# Patient Record
Sex: Female | Born: 1980 | Race: White | Hispanic: No | Marital: Married | Smoking: Never smoker
Health system: Southern US, Community
[De-identification: ages and names within clinical notes are randomized; demographics above are authoritative.]

## PROBLEM LIST (undated history)

## (undated) DIAGNOSIS — N200 Calculus of kidney: Secondary | ICD-10-CM

## (undated) DIAGNOSIS — B019 Varicella without complication: Secondary | ICD-10-CM

## (undated) DIAGNOSIS — E785 Hyperlipidemia, unspecified: Secondary | ICD-10-CM

## (undated) DIAGNOSIS — N390 Urinary tract infection, site not specified: Secondary | ICD-10-CM

## (undated) HISTORY — DX: Varicella without complication: B01.9

## (undated) HISTORY — DX: Urinary tract infection, site not specified: N39.0

## (undated) HISTORY — DX: Calculus of kidney: N20.0

## (undated) HISTORY — DX: Hyperlipidemia, unspecified: E78.5

## (undated) HISTORY — PX: FOOT SURGERY: SHX648

---

## 1998-07-12 ENCOUNTER — Emergency Department (HOSPITAL_COMMUNITY): Admission: EM | Admit: 1998-07-12 | Discharge: 1998-07-12 | Payer: Self-pay | Admitting: Emergency Medicine

## 1998-07-12 ENCOUNTER — Encounter: Payer: Self-pay | Admitting: Emergency Medicine

## 1999-02-25 ENCOUNTER — Other Ambulatory Visit: Admission: RE | Admit: 1999-02-25 | Discharge: 1999-02-25 | Payer: Self-pay | Admitting: Obstetrics and Gynecology

## 2001-05-18 ENCOUNTER — Other Ambulatory Visit: Admission: RE | Admit: 2001-05-18 | Discharge: 2001-05-18 | Payer: Self-pay | Admitting: Obstetrics & Gynecology

## 2002-05-21 ENCOUNTER — Other Ambulatory Visit: Admission: RE | Admit: 2002-05-21 | Discharge: 2002-05-21 | Payer: Self-pay | Admitting: *Deleted

## 2003-05-29 ENCOUNTER — Other Ambulatory Visit: Admission: RE | Admit: 2003-05-29 | Discharge: 2003-05-29 | Payer: Self-pay | Admitting: Obstetrics & Gynecology

## 2003-06-27 ENCOUNTER — Emergency Department (HOSPITAL_COMMUNITY): Admission: EM | Admit: 2003-06-27 | Discharge: 2003-06-27 | Payer: Self-pay | Admitting: Family Medicine

## 2005-03-19 ENCOUNTER — Inpatient Hospital Stay (HOSPITAL_COMMUNITY): Admission: AD | Admit: 2005-03-19 | Discharge: 2005-03-20 | Payer: Self-pay | Admitting: Obstetrics & Gynecology

## 2005-04-01 ENCOUNTER — Inpatient Hospital Stay (HOSPITAL_COMMUNITY): Admission: AD | Admit: 2005-04-01 | Discharge: 2005-04-04 | Payer: Self-pay | Admitting: Urology

## 2005-04-02 ENCOUNTER — Encounter (INDEPENDENT_AMBULATORY_CARE_PROVIDER_SITE_OTHER): Payer: Self-pay | Admitting: *Deleted

## 2005-04-05 ENCOUNTER — Encounter: Admission: RE | Admit: 2005-04-05 | Discharge: 2005-05-02 | Payer: Self-pay | Admitting: Obstetrics & Gynecology

## 2005-05-03 ENCOUNTER — Encounter: Admission: RE | Admit: 2005-05-03 | Discharge: 2005-05-11 | Payer: Self-pay | Admitting: Obstetrics & Gynecology

## 2010-02-21 ENCOUNTER — Inpatient Hospital Stay (HOSPITAL_COMMUNITY): Admission: AD | Admit: 2010-02-21 | Payer: Self-pay | Admitting: Obstetrics and Gynecology

## 2010-06-03 ENCOUNTER — Inpatient Hospital Stay (HOSPITAL_COMMUNITY): Admission: RE | Admit: 2010-06-03 | Payer: Self-pay | Source: Ambulatory Visit | Admitting: Obstetrics & Gynecology

## 2010-06-03 ENCOUNTER — Inpatient Hospital Stay (HOSPITAL_COMMUNITY)
Admission: RE | Admit: 2010-06-03 | Discharge: 2010-06-05 | DRG: 373 | Disposition: A | Discharge status: Home / Self Care | Payer: BLUE CROSS/BLUE SHIELD | Source: Ambulatory Visit | Attending: Obstetrics & Gynecology | Admitting: Obstetrics & Gynecology

## 2010-06-03 DIAGNOSIS — O3660X0 Maternal care for excessive fetal growth, unspecified trimester, not applicable or unspecified: Principal | ICD-10-CM | POA: Diagnosis present

## 2010-06-03 DIAGNOSIS — Z22330 Carrier of Group B streptococcus: Secondary | ICD-10-CM

## 2010-06-03 DIAGNOSIS — O99892 Other specified diseases and conditions complicating childbirth: Secondary | ICD-10-CM | POA: Diagnosis present

## 2010-06-03 DIAGNOSIS — O9903 Anemia complicating the puerperium: Secondary | ICD-10-CM | POA: Diagnosis not present

## 2010-06-03 DIAGNOSIS — D62 Acute posthemorrhagic anemia: Secondary | ICD-10-CM | POA: Diagnosis not present

## 2010-06-03 LAB — CBC
HCT: 34.3 % — ABNORMAL LOW (ref 36.0–46.0)
Hemoglobin: 11.8 g/dL — ABNORMAL LOW (ref 12.0–15.0)
MCH: 32 pg (ref 26.0–34.0)
MCHC: 34.4 g/dL (ref 30.0–36.0)
MCV: 93 fL (ref 78.0–100.0)
Platelets: 180 10*3/uL (ref 150–400)
RBC: 3.69 MIL/uL — ABNORMAL LOW (ref 3.87–5.11)
RDW: 14.1 % (ref 11.5–15.5)
WBC: 10 10*3/uL (ref 4.0–10.5)

## 2010-06-03 LAB — SYPHILIS: RPR W/REFLEX TO RPR TITER AND TREPONEMAL ANTIBODIES, TRADITIONAL SCREENING AND DIAGNOSIS ALGORITHM: RPR Ser Ql: NONREACTIVE

## 2010-06-04 LAB — CBC
HCT: 24 % — ABNORMAL LOW (ref 36.0–46.0)
Hemoglobin: 8.3 g/dL — ABNORMAL LOW (ref 12.0–15.0)
MCH: 32.2 pg (ref 26.0–34.0)
MCHC: 34.6 g/dL (ref 30.0–36.0)
MCV: 93 fL (ref 78.0–100.0)
Platelets: 188 10*3/uL (ref 150–400)
RBC: 2.58 MIL/uL — ABNORMAL LOW (ref 3.87–5.11)
RDW: 13.9 % (ref 11.5–15.5)
WBC: 15.1 10*3/uL — ABNORMAL HIGH (ref 4.0–10.5)

## 2010-06-04 LAB — ABO/RH: ABO/RH(D): O NEG

## 2010-06-05 LAB — CBC
HCT: 20.8 % — ABNORMAL LOW (ref 36.0–46.0)
Hemoglobin: 7 g/dL — ABNORMAL LOW (ref 12.0–15.0)
MCH: 31.7 pg (ref 26.0–34.0)
MCHC: 33.7 g/dL (ref 30.0–36.0)
MCV: 94.1 fL (ref 78.0–100.0)
Platelets: 162 10*3/uL (ref 150–400)
RBC: 2.21 MIL/uL — ABNORMAL LOW (ref 3.87–5.11)
RDW: 14.3 % (ref 11.5–15.5)
WBC: 12.2 10*3/uL — ABNORMAL HIGH (ref 4.0–10.5)

## 2010-07-10 NOTE — Op Note (Signed)
Dorothy, Campbell                ACCOUNT NO.:  [id number]   MEDICAL RECORD NO.:  [id number]          PATIENT TYPE:  INP   LOCATION:  9126                          FACILITY:  [facility]   PHYSICIAN:  Richardean Sale, M.D.   DATE OF BIRTH:  1981-01-07   DATE OF PROCEDURE:  04/02/2005  DATE OF DISCHARGE:                                 OPERATIVE REPORT   PREOPERATIVE DIAGNOSIS:  Forty-week intrauterine pregnancy with fetal  tachycardia in the second stage.   POSTOPERATIVE DIAGNOSIS:  Forty-week intrauterine pregnancy with fetal  tachycardia in the second stage.   OPERATION/PROCEDURE:  Low vacuum-assisted vaginal delivery.   SURGEON:  Richardean Sale, M.D.   ASSISTANT:  Marlinda Mike, C.N.M.   ANESTHESIA:  Epidural.   COMPLICATIONS:  None.   ESTIMATED BLOOD LOSS:  300 mL.   FINDINGS:  Viable female infant with Apgars of 8 and 9.  Intact placenta with  three-vessel cord.  Arterial cord PH of 7.29.   INDICATIONS:  This is a 30 year old, gravida 1, para 0, white female who is  at [redacted] weeks gestation who presented with spontaneous rupture of membranes on  the evening of April 01, 2005.  The patient made excellent progress in  labor and only required minimum of 2 mU of Pitocin.  She progressed along a  normal labor curve to complete dilation at which time the patient developed  a fever.  T-max during labor was 102.7.  Fetal heart rate baseline increased  and the patient received intravenous ampicillin and gentamicin to treat  chorioamnionitis.  The patient made excellent progress during the second  stage.  She pushed approximately 90 minutes.  Fetal heart rate tracing was  in the 180's with good variability.  At this point the fetal vertex was at a  +2 station.  The pelvis was then examined and felt adequate.  On Leopold's  maneuvers performed at the time of admission, the estimated fetal weigh was  thought to be approximately 8 pounds.  The patient had undergone an  ultrasound at 36 week  which showed estimated fetal weight at 50th  percentile.  At this point decision was made to proceed with an operative  vaginal delivery given the fetal tachycardia and maternal exhaustion.  The  patient was verbally consented and instructed in the use of the vacuum and  the patient and husband agreed to proceed.  The bladder was then drained  with a red rubber catheter.  The sagittal  suture was inthe midline and  presentation was occiput anterior.  At this point the Kiwi vacuum was  initially applied, but an inadequate seal with the vacuum could be obtained.  Therefore, this was discarded and the Mitey Vac was then used.  A total of  three applications of the vacuum and three contractions were used to deliver  the fetal vertex and there were no pop-offs.  A second-degree midline  episiotomy was cut to facilitate the delivery.  Upon delivery of the fetal  vertex, this extended to a third degree.  The vacuum was discontinued.  Nose  and mouth were suctioned.  The  shoulders were then delivered with the use of  McRoberts maneuver and suprapubic pressure and were delivered within 10  seconds after delivery of the head.  The infant was then delivered to the  mom's abdomen.  The cord was clamped and cut and the infant was handed off  to the waiting NICU attendant.  Arterial cord gases obtained which was 7.29.  Apgars were 8 and 9.  The placenta was then delivered spontaneous and  intact.  It was sent to pathology.  The uterus was then explored.  There was  no retained placental tissue.  The vagina and then perineum were inspected.  There was extension of the second-degree episiotomy to a third-degree but  the rectum was intact.  Along the right vaginal sidewall there was a very  large sulcus tear.  This was repaired using 3-0 Vicryl in a standard fashion  by closing in two layers and was hemostatic.  The third-degree perineal  laceration was repaired with Vicryl suture in the standard fashion.   It was  hemostatic.  A rectal exam was performed and the sphincter muscles were  intact and the rectal mucosa was intact as well.  At this point the  procedure was terminated.  The patient tolerated the procedure very well.  All sponges were removed from the patient's vagina.  Infant was taken to the  well-baby nursery and mom was doing well at the time of this dictation.      Richardean Sale, M.D.  Electronically Signed     JW/MEDQ  D:  04/02/2005  T:  04/03/2005  Job:  [id number]

## 2010-07-16 ENCOUNTER — Other Ambulatory Visit: Payer: Self-pay | Admitting: Obstetrics & Gynecology

## 2011-01-29 ENCOUNTER — Other Ambulatory Visit: Payer: Self-pay | Admitting: Dermatology

## 2011-02-23 HISTORY — PX: AUGMENTATION MAMMAPLASTY: SUR837

## 2011-03-26 LAB — HM PAP SMEAR

## 2012-02-24 ENCOUNTER — Other Ambulatory Visit: Payer: Self-pay | Admitting: Dermatology

## 2012-03-14 ENCOUNTER — Encounter: Payer: Self-pay | Admitting: Family Medicine

## 2012-03-14 ENCOUNTER — Ambulatory Visit (INDEPENDENT_AMBULATORY_CARE_PROVIDER_SITE_OTHER): Payer: BC Managed Care – PPO | Admitting: Family Medicine

## 2012-03-14 VITALS — HR 67 | Temp 98.4°F | Wt 132.0 lb

## 2012-03-14 DIAGNOSIS — J309 Allergic rhinitis, unspecified: Secondary | ICD-10-CM

## 2012-03-14 DIAGNOSIS — E785 Hyperlipidemia, unspecified: Secondary | ICD-10-CM

## 2012-03-14 DIAGNOSIS — J3489 Other specified disorders of nose and nasal sinuses: Secondary | ICD-10-CM | POA: Insufficient documentation

## 2012-03-14 MED ORDER — FLUTICASONE PROPIONATE 50 MCG/ACT NA SUSP: 2.0000 | Freq: Every day | NASAL | Status: DC

## 2012-03-14 NOTE — Patient Instructions (Addendum)
-  We have ordered labs or studies at this visit. It can take up to 1-2 weeks for results and processing. We will contact you with instructions IF your results are abnormal. Normal results will be released to your [facility]. If you have not heard from [place] or can not find your results in [facility] in 2 weeks please contact our office.  -PLEASE SIGN UP FOR MYCHART TODAY   We recommend the following healthy lifestyle measures: - eat a healthy diet consisting of lots of vegetables, fruits, beans, nuts, seeds, healthy meats such as white chicken and fish and whole grains.  - avoid fried foods, fast food, processed foods, sodas, red meet and other fattening foods.  - get a least 150 minutes of aerobic exercise per week.   FOR YOUR SINUS PRESSURE: -trial of humidifier, nasal saline (follow instructions) and flonase -take zyrtec every night -do not take any over the counter pain medications -can use topical menthol if needed for pain - avoid eyes  Follow up in: 1 month

## 2012-03-14 NOTE — Progress Notes (Signed)
Chief Complaint  Patient presents with  . Establish Care    HPI:  Dorothy Campbell is here to establish care.  Last PCP and physical: Dr. Seymour Bars - had physical about 9 months ago. Had labs recently per her report.  Has the following chronic problems and concerns today:  Sinus Issues: -had flu endo of Dec and tx with Tamiflu -tx for ear infection Jan 1st for 10 days -Jan 10, given augmentin for 10 days and just finished this 4 days ago -on monistat for yeast infection -still feeling pain and pressure in upper nose and frontal area -denies: fevers, congestion, coughing, drainage, vision changes -has been taking ibuprofen for over a month daily -does have allergies, sneezing, itchy eyes, watery nose  Patient Active Problem List  Diagnosis  . Sinus pressure  . Dyslipidemia - mild    Health Maintenance: -had flu vaccine, utd on other health maintenance  ROS: See pertinent positives and negatives per HPI.  History reviewed. No pertinent past medical history.  Family History  Problem Relation Age of Onset  . Diabetes Mother   . Diabetes Maternal Grandmother   . Heart disease Maternal Grandfather     CHF  . Diabetes Maternal Grandfather   . Heart disease Paternal Grandfather     MI    History   Social History  . Marital Status: Married    Spouse Name: N/A    Number of Children: N/A  . Years of Education: N/A   Social History Main Topics  . Smoking status: Never Smoker   . Smokeless tobacco: None  . Alcohol Use: Yes     Comment: occ  . Drug Use: None  . Sexually Active: None   Other Topics Concern  . None   Social History Narrative   Work or School: works in [employer] for [employer] Situation: living with spouse and 2 children Spiritual Beliefs: christianLifestyle: 2x per week goes to gym for cardio and weights, healthy diet    Current outpatient prescriptions:JUNEL 1/20 1-20 MG-MCG tablet, Take 1 tablet by mouth daily. , Disp: , Rfl: ;  Multiple Vitamin  (MULTIVITAMIN) tablet, Take 1 tablet by mouth daily., Disp: , Rfl: ;  fluticasone (FLONASE) 50 MCG/ACT nasal spray, Place 2 sprays into the nose daily., Disp: 16 g, Rfl: 1  EXAM:  Filed Vitals:   03/14/12 0817  Pulse: 67  Temp: 98.4 F (36.9 C)    There is no height on file to calculate BMI.  GENERAL: vitals reviewed and listed above, alert, oriented, appears well hydrated and in no acute distress  HEENT: atraumatic, conjunttiva clear, allergic shiners, no obvious abnormalities on inspection of external nose and ears, normal appearance of ear canals and TMs except small bilat effusion, clear nasal congestion, pale boggy turbinates, mild post oropharyngeal erythema with PND and cobblestoning, no tonsillar edema or exudate, no sinus TTP  NECK: no obvious masses on inspection  LUNGS: clear to auscultation bilaterally, no wheezes, rales or rhonchi, good air movement  CV: HRRR, no peripheral edema  MS: moves all extremities without noticeable abnormality  PSYCH: pleasant and cooperative, no obvious depression or anxiety  ASSESSMENT AND PLAN:  Discussed the following assessment and plan:  1. Sinus pressure    2. Dyslipidemia - mild    3. Allergic rhinitis  fluticasone (FLONASE) 50 MCG/ACT nasal spray   -free sinus issues more likely allergy related at this point aft 20 days of antibiotics and exam findings. Tx per below. Return precautions. Also, could possibly  be suffering from daily rebound headaches from analgesic use. Close follow up - if not improving may refer to ENT.  -We reviewed the PMH, PSH, FH, SH, Meds and Allergies. -We provided refills for any medications we will prescribe as needed. -We addressed current concerns per orders and patient instructions. -We have asked for records for pertinent exams, studies, vaccines and notes from previous providers - previous labs and pap results -We have advised patient to follow up per instructions below.  -Patient advised to  return or notify a doctor immediately if symptoms worsen or persist or new concerns arise.  Patient Instructions  -We have ordered labs or studies at this visit. It can take up to 1-2 weeks for results and processing. We will contact you with instructions IF your results are abnormal. Normal results will be released to your [facility]. If you have not heard from [place] or can not find your results in [facility] in 2 weeks please contact our office.  -PLEASE SIGN UP FOR MYCHART TODAY   We recommend the following healthy lifestyle measures: - eat a healthy diet consisting of lots of vegetables, fruits, beans, nuts, seeds, healthy meats such as white chicken and fish and whole grains.  - avoid fried foods, fast food, processed foods, sodas, red meet and other fattening foods.  - get a least 150 minutes of aerobic exercise per week.   FOR YOUR SINUS PRESSURE: -trial of humidifier, nasal saline (follow instructions) and flonase -take zyrtec every night -do not take any over the counter pain medications -can use topical menthol if needed for pain - avoid eyes  Follow up in: 1 month      Luis Sami R.

## 2012-03-16 ENCOUNTER — Encounter: Payer: Self-pay | Admitting: Family Medicine

## 2012-03-16 ENCOUNTER — Ambulatory Visit (INDEPENDENT_AMBULATORY_CARE_PROVIDER_SITE_OTHER): Payer: BC Managed Care – PPO | Admitting: Family Medicine

## 2012-03-16 VITALS — BP 100/64 | HR 122 | Temp 99.2°F | Wt 129.0 lb

## 2012-03-16 DIAGNOSIS — J019 Acute sinusitis, unspecified: Secondary | ICD-10-CM

## 2012-03-16 NOTE — Patient Instructions (Signed)
Please see the ENT doctor according to appointment details provided.

## 2012-03-16 NOTE — Progress Notes (Signed)
Chief Complaint  Patient presents with  . Fever    chills, headache, body aches     HPI: -started: about 1 month ago, seen a few days ago - felt likely allergic in nature with trial of flonase and zyretec advised -symptoms:nasal congestion, sore throat, sinus pain and pressure - maxillary and frontal, HA, cough, chills, body aches - feels like sinus issues worsened yesterday and had a fever of 100.4 but had hot tea before taking temperature -denies:fever, SOB, NVD, tooth pain, strep or mono exposure -has tried: tamiflu, and course of amoxicillin and augmentin, flonase, zyrtec -sick contacts: none known -Hx of: allergies   ROS: See pertinent positives and negatives per HPI.  Past Medical History  Diagnosis Date  . Kidney stone   . UTI (urinary tract infection)     Family History  Problem Relation Age of Onset  . Diabetes Mother   . Diabetes Maternal Grandmother   . Heart disease Maternal Grandfather     CHF  . Diabetes Maternal Grandfather   . Heart disease Paternal Grandfather     MI    History   Social History  . Marital Status: Married    Spouse Name: N/A    Number of Children: N/A  . Years of Education: N/A   Social History Main Topics  . Smoking status: Never Smoker   . Smokeless tobacco: None  . Alcohol Use: Yes     Comment: occ  . Drug Use: None  . Sexually Active: None   Other Topics Concern  . None   Social History Narrative   Work or School: works in [employer] for [employer] Situation: living with spouse and 2 children Spiritual Beliefs: christianLifestyle: 2x per week goes to gym for cardio and weights, healthy diet    Current outpatient prescriptions:fluticasone (FLONASE) 50 MCG/ACT nasal spray, Place 2 sprays into the nose daily., Disp: 16 g, Rfl: 1;  JUNEL 1/20 1-20 MG-MCG tablet, Take 1 tablet by mouth daily. , Disp: , Rfl: ;  Multiple Vitamin (MULTIVITAMIN) tablet, Take 1 tablet by mouth daily., Disp: , Rfl:   EXAM:  Filed Vitals:   03/16/12 0926  BP: 100/64  Pulse: 122  Temp: 99.2 F (37.3 C)    There is no height on file to calculate BMI.  GENERAL: vitals reviewed and listed above, alert, oriented, appears well hydrated and in no acute distress  HEENT: atraumatic, conjunttiva clear, no obvious abnormalities on inspection of external nose and ears, normal appearance of ear canals and TMs, white nasal congestion R>L, mild post oropharyngeal erythema with PND, no tonsillar edema or exudate, mild R maxillary sinus TTP  NECK: no obvious masses on inspection  LUNGS: clear to auscultation bilaterally, no wheezes, rales or rhonchi, good air movement  CV: HRRR, no peripheral edema  MS: moves all extremities without noticeable abnormality  PSYCH: pleasant and cooperative, no obvious depression or anxiety  ASSESSMENT AND PLAN:  Discussed the following assessment and plan:  1. Subacute sinusitis  Ambulatory referral to ENT   -recurrent sings and symptoms c/w sinusitis. S/p 10 days of amoxicillin, 10 days of augmentin and topical corticosteroids. Now with worsening of symptoms. Pt very concerned. Discussed options and will have her see ENT for evaluations.  Referral placed. -Patient advised to return or notify a doctor immediately if symptoms worsen or persist or new concerns arise.  Patient Instructions  Please see the ENT doctor according to appointment details provided.     Kriste Basque R.

## 2012-03-17 ENCOUNTER — Encounter: Payer: Self-pay | Admitting: Family Medicine

## 2012-03-17 ENCOUNTER — Telehealth: Payer: Self-pay | Admitting: Family Medicine

## 2012-03-17 NOTE — Progress Notes (Signed)
Some labs from [facility] obtained.  Pap 07/2011 - normal Cmp, CBC, TSH, Free T3, Free T4, Lipids from 09/2011 unremarkable   In scan box.

## 2012-03-17 NOTE — Telephone Encounter (Signed)
[facility] Triage Call Report Triage Record Num: [id number] Operator: Ether Griffins Patient Name: Dorothy Campbell Call Date & Time: 03/15/2012 8:01:33PM Patient Phone: [phone] PCP: Kriste Basque Patient Gender: Female PCP Fax : Patient DOB: December 14, 1980 [facility] Name: [facility] Reason for Call: Caller: [name]/Patient; PCP: Kriste Basque (Family Practice); CB#: [phone]; Calling about headache, fever,feels cold and chills,"sinuses are throbbing",legs ache. Temp 100.4(PO). Onset 03/14/12. Dx with flu 02/19/13, ear infection 02/23/12, sinus infection 02/29/12(put on Augmentin); then saw Dr Selena Batten on 03/14/12 and dx with allergies--started on Zyrtec, Flonase and advised to stop taking Tylenol, Ibuprofen & ASA. Just started running a fever last night and still has headache and sinus pain. Guideline: Flu-Like Symptoms. Disposition: See Provider Within 24 Hours. Reason for Disposition: Flu-like illness that has not improved after 7 days of home care. Appt scheduled on 03/16/12 @ 0915 with Dr Selena Batten. Protocol(s) Used: Flu-Like Symptoms Protocol(s) Used: Upper Respiratory Infection (URI) Recommended Outcome per Protocol: See Provider within 24 hours Reason for Outcome: Sudden onset of flu-like symptoms Flu-like illness that has not improved after 7 days of home care Care Advice: ~ SYMPTOM / CONDITION MANAGEMENT COMFORT MEASURES FOR A FEVER: - Drink cool liquids or eat ice chips or popsicles. Avoid drinks with alcohol or caffeine. - Wear one layer of light-weight clothing. - Consider using a fan to improve circulation. - Rest until temperature returns to normal and other symptoms improve. - Use a lightweight blanket or other bedding. - A lukewarm (not cold) bath or shower can help lower body temperature. Cold water can cause shivering and raise temperature. If shivering starts, dry off and cover with lightweight clothing. ~ 03/15/2012 8:28:42PM Page 1 of 1 [id number]

## 2012-04-03 ENCOUNTER — Other Ambulatory Visit: Payer: Self-pay | Admitting: Dermatology

## 2012-04-14 ENCOUNTER — Ambulatory Visit: Payer: BC Managed Care – PPO | Admitting: Family Medicine

## 2013-12-24 ENCOUNTER — Encounter: Payer: Self-pay | Admitting: Family Medicine

## 2014-04-25 ENCOUNTER — Other Ambulatory Visit: Payer: Self-pay | Admitting: Dermatology

## 2014-08-22 ENCOUNTER — Ambulatory Visit (INDEPENDENT_AMBULATORY_CARE_PROVIDER_SITE_OTHER): Payer: PRIVATE HEALTH INSURANCE | Admitting: Podiatry

## 2014-08-22 ENCOUNTER — Ambulatory Visit: Payer: Self-pay

## 2014-08-22 ENCOUNTER — Encounter: Payer: Self-pay | Admitting: Podiatry

## 2014-08-22 VITALS — BP 106/60 | HR 64 | Resp 12

## 2014-08-22 DIAGNOSIS — M2020 Hallux rigidus, unspecified foot: Secondary | ICD-10-CM | POA: Diagnosis not present

## 2014-08-22 DIAGNOSIS — M79673 Pain in unspecified foot: Secondary | ICD-10-CM | POA: Diagnosis not present

## 2014-08-22 NOTE — Progress Notes (Signed)
   Subjective:    Patient ID: Dorothy Campbell, female    DOB: 03/12/1980, 34 y.o.   MRN: [id number]  HPI Patient has had joint pain for over a year and previous doctors have detected a mass of some sort. Only bothers patient during physical activity or when she flexes toes. Patient also stated that she has numbness on L toes 2 and 4.   Review of Systems     Objective:   Physical Exam        Assessment & Plan:

## 2014-08-22 NOTE — Progress Notes (Signed)
Subjective:     Patient ID: Dorothy Campbell, female   DOB: 08/06/1980, 34 y.o.   MRN: [id number]  HPI patient presents stating I have had pain in my big toe joint right over left for at least the last year. I seen another physician who did not have an idea what was causing the problem and it's making it hard for me to run or take walks. It seems to be getting worse and I know something is wrong   Review of Systems  All other systems reviewed and are negative.      Objective:   Physical Exam  Constitutional: She is oriented to person, place, and time.  Cardiovascular: Intact distal pulses.   Musculoskeletal: Normal range of motion.  Neurological: She is oriented to person, place, and time.  Skin: Skin is warm.  Nursing note and vitals reviewed.  neurovascular status intact muscle strength adequate range of motion within normal limits of the subtalar midtarsal joint. Mild restriction of motion first MPJ bilateral with quite a bit of discomfort within the metatarsophalangeal joint when palpated and enlargement consistent with spurs dorsal surface first metatarsal. 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 Also may have mild neuroma symptomatology left with an absence of burning or shooting pain    Plan:     Hallux limitus deformity bilateral right over left with pain and spur formation that I reviewed with the patient and discussed. We reviewed H&P and x-rays and discussed the condition and I offered treatment options of conservative orthotics with medication or consideration since they're spurs for surgery. She wants to have them fixed and is given a have that done in August and at this point I reviewed a biplanar-type Austin osteotomy with removal of spurs and possible osteochondral drilling  area she is scheduled for surgery in mid August and will reappoint beginning of August to review

## 2014-08-26 ENCOUNTER — Ambulatory Visit: Payer: PRIVATE HEALTH INSURANCE | Admitting: Podiatry

## 2014-09-25 ENCOUNTER — Telehealth: Payer: Self-pay | Admitting: *Deleted

## 2014-09-25 NOTE — Telephone Encounter (Signed)
I attempted to call patient to see if we could reschedule her surgery from 10/02/2014 to 10/01/2014 per Dr. Charlsie Merles.  I left her a voicemail message to call me back.

## 2014-09-25 NOTE — Telephone Encounter (Signed)
I attempted to call patient again to see if she could reschedule her surgery to 10/01/2014.  I left a message for her to call regarding possibly rescheduling.  Ask for [facility].

## 2014-09-25 NOTE — Telephone Encounter (Signed)
I attempted to call patient and inform her to disregard messages.  Her mailbox was full, couldn't leave a message.  We will leave her surgery as scheduled for 10/02/2014.  Please disregard all messages.

## 2014-09-26 ENCOUNTER — Ambulatory Visit (INDEPENDENT_AMBULATORY_CARE_PROVIDER_SITE_OTHER): Payer: PRIVATE HEALTH INSURANCE | Admitting: Podiatry

## 2014-09-26 ENCOUNTER — Encounter: Payer: Self-pay | Admitting: Podiatry

## 2014-09-26 VITALS — BP 99/58 | HR 63 | Resp 18

## 2014-09-26 DIAGNOSIS — M2020 Hallux rigidus, unspecified foot: Secondary | ICD-10-CM | POA: Diagnosis not present

## 2014-09-26 DIAGNOSIS — M79673 Pain in unspecified foot: Secondary | ICD-10-CM | POA: Diagnosis not present

## 2014-09-26 NOTE — Progress Notes (Signed)
Subjective:     Patient ID: Dorothy Campbell, female   DOB: 1980/09/07, 34 y.o.   MRN: [id number]  HPI patient presents stating I'm ready to get my big toe joint fixed on my left and then my right one 4 weeks later. He gets very sore and it's affecting my ability to exercise   Review of Systems     Objective:   Physical Exam Neurovascular status intact with inflammation and pain first metatarsal phalangeal joint left over right with dorsal spur formation and narrowing of the joint and indications of arthritis mostly on the lateral side of the first MPJ    Assessment:     Hallux limitus deformity with spur formation and possible cartilage issues of the first MPJ bilateral    Plan:     H&P and x-rays reviewed with patient. At this point I recommended a removal of spur probable osteotomy first metatarsal with possible subchondral bone drilling and I did allow her to read consent form concerning procedure. I did explain to her there is no guarantee of success and possibility for fusion or implantation is present for the future and the told recovery. Can take 6 months to one year. Patient understands all of this wants surgery and signs consent form at this time and is scheduled for outpatient surgery. Dispensed air fracture walker with instructions on usage at this time

## 2014-09-26 NOTE — Telephone Encounter (Signed)
Informed pt her surgery with Dr. Ardelle Anton was still on 10/02/2014, and [facility] would call 24-48 hour prior with surgery time.

## 2014-09-26 NOTE — Patient Instructions (Signed)
Pre-Operative Instructions  Congratulations, you have decided to take an important step to improving your quality of life.  You can be assured that the doctors of [facility] will be with you every step of the way.  1. Plan to be at the surgery center/hospital at least 1 (one) hour prior to your scheduled time unless otherwise directed by the surgical center/hospital staff.  You must have a responsible adult accompany you, remain during the surgery and drive you home.  Make sure you have directions to the surgical center/hospital and know how to get there on time. 2. For hospital based surgery you will need to obtain a history and physical form from your family physician within 1 month prior to the date of surgery- we will give you a form for you primary physician.  3. We make every effort to accommodate the date you request for surgery.  There are however, times where surgery dates or times have to be moved.  We will contact you as soon as possible if a change in schedule is required.   4. No Aspirin/Ibuprofen for one week before surgery.  If you are on aspirin, any non-steroidal anti-inflammatory medications (Mobic, Aleve, Ibuprofen) you should stop taking it 7 days prior to your surgery.  You make take Tylenol  For pain prior to surgery.  5. Medications- If you are taking daily heart and blood pressure medications, seizure, reflux, allergy, asthma, anxiety, pain or diabetes medications, make sure the surgery center/hospital is aware before the day of surgery so they may notify you which medications to take or avoid the day of surgery. 6. No food or drink after midnight the night before surgery unless directed otherwise by surgical center/hospital staff. 7. No alcoholic beverages 24 hours prior to surgery.  No smoking 24 hours prior to or 24 hours after surgery. 8. Wear loose pants or shorts- loose enough to fit over bandages, boots, and casts. 9. No slip on shoes, sneakers are best. 10. Bring  your boot with you to the surgery center/hospital.  Also bring crutches or a walker if your physician has prescribed it for you.  If you do not have this equipment, it will be provided for you after surgery. 11. If you have not been contracted by the surgery center/hospital by the day before your surgery, call to confirm the date and time of your surgery. 12. Leave-time from work may vary depending on the type of surgery you have.  Appropriate arrangements should be made prior to surgery with your employer. 13. Prescriptions will be provided immediately following surgery by your doctor.  Have these filled as soon as possible after surgery and take the medication as directed. 14. Remove nail polish on the operative foot. 15. Wash the night before surgery.  The night before surgery wash the foot and leg well with the antibacterial soap provided and water paying special attention to beneath the toenails and in between the toes.  Rinse thoroughly with water and dry well with a towel.  Perform this wash unless told not to do so by your physician.  Enclosed: 1 Ice pack (please put in freezer the night before surgery)   1 Hibiclens skin cleaner   Pre-op Instructions  If you have any questions regarding the instructions, do not hesitate to call our office.  [place]: [address] [place], [place] [address] [phone]  [place]: [address] [place], [place] [address] [phone]  [place]: [address]  [place], [place] [address] [phone]  Dr. Richard   Tuchman DPM, Dr. Norman Regal DPM Dr. Richard Sikora DPM, Dr. M. Todd Hyatt DPM, Dr. Kathryn Egerton DPM 

## 2014-10-02 ENCOUNTER — Encounter: Payer: Self-pay | Admitting: Podiatry

## 2014-10-02 DIAGNOSIS — M2012 Hallux valgus (acquired), left foot: Secondary | ICD-10-CM | POA: Diagnosis not present

## 2014-10-03 ENCOUNTER — Telehealth: Payer: Self-pay | Admitting: *Deleted

## 2014-10-03 NOTE — Telephone Encounter (Signed)
Courtesy call, left message instructing pt to wear boot at all times, even sleeping, but could open the boot and rotate the foot and ice if RESTing not sleeping, not to weight bear more than 5 mins/hour the 1st week post op and take the pain medication as directed, and if tolerates can use OTC Ibuprofen in between pain medication if needed, and to call our office with concerns.

## 2014-10-03 NOTE — Progress Notes (Signed)
DOS 10/02/2014 Bi-planar osteotomy with pin fixation left foot.

## 2014-10-09 ENCOUNTER — Ambulatory Visit (INDEPENDENT_AMBULATORY_CARE_PROVIDER_SITE_OTHER): Payer: PRIVATE HEALTH INSURANCE

## 2014-10-09 ENCOUNTER — Encounter: Payer: Self-pay | Admitting: Podiatry

## 2014-10-09 ENCOUNTER — Ambulatory Visit (INDEPENDENT_AMBULATORY_CARE_PROVIDER_SITE_OTHER): Payer: PRIVATE HEALTH INSURANCE | Admitting: Podiatry

## 2014-10-09 VITALS — BP 87/49 | HR 83 | Resp 16

## 2014-10-09 DIAGNOSIS — M79673 Pain in unspecified foot: Secondary | ICD-10-CM | POA: Diagnosis not present

## 2014-10-09 DIAGNOSIS — M2020 Hallux rigidus, unspecified foot: Secondary | ICD-10-CM | POA: Diagnosis not present

## 2014-10-09 NOTE — Progress Notes (Signed)
Subjective:     Patient ID: Dorothy Campbell, female   DOB: 1980/03/06, 34 y.o.   MRN: [id number]  HPI  Patient states I'm doing real well with my left foot and I like to get my right foot fixed in 4 weeks   Review of Systems     Objective:   Physical Exam  neurovascular status is intact negative Homans sign noted with wound edges well coapted and good range of motion first MPJ with 25 dorsiflexion 20 plantar flexion with no pain or crepitus noted within the joint    Assessment:      doing well post osteotomy first metatarsal left    Plan:      x-rays reviewed with patient and advised on continued compression elevation and immobilization. Patient will be seen back in 3 weeks for reevaluation and to discuss correction of the right foot or earlier if any issues should occur

## 2014-10-23 ENCOUNTER — Encounter: Payer: Self-pay | Admitting: Podiatry

## 2014-10-23 ENCOUNTER — Ambulatory Visit (INDEPENDENT_AMBULATORY_CARE_PROVIDER_SITE_OTHER): Payer: PRIVATE HEALTH INSURANCE

## 2014-10-23 ENCOUNTER — Ambulatory Visit (INDEPENDENT_AMBULATORY_CARE_PROVIDER_SITE_OTHER): Payer: PRIVATE HEALTH INSURANCE | Admitting: Podiatry

## 2014-10-23 ENCOUNTER — Ambulatory Visit: Payer: Self-pay

## 2014-10-23 ENCOUNTER — Telehealth: Payer: Self-pay | Admitting: *Deleted

## 2014-10-23 VITALS — Temp 98.7°F

## 2014-10-23 DIAGNOSIS — G8918 Other acute postprocedural pain: Secondary | ICD-10-CM | POA: Diagnosis not present

## 2014-10-23 DIAGNOSIS — L03119 Cellulitis of unspecified part of limb: Secondary | ICD-10-CM

## 2014-10-23 DIAGNOSIS — L02619 Cutaneous abscess of unspecified foot: Secondary | ICD-10-CM

## 2014-10-23 MED ORDER — CEPHALEXIN 500 MG PO CAPS: 500.0000 mg | ORAL_CAPSULE | Freq: Three times a day (TID) | ORAL | Status: DC

## 2014-10-23 NOTE — Progress Notes (Signed)
Subjective:     Patient ID: Dorothy Campbell, female   DOB: 11-Mar-1980, 34 y.o.   MRN: [id number]  HPI patient presents stating I'm doing pretty well with diminished discomfort but I do have some redness on top of that left foot that I just wanted to get check with some swelling and also I'm do to get my other foot fixed in 2 weeks   Review of Systems     Objective:   Physical Exam Neurovascular status intact with mild localized redness around the proximal portion of the incision site left first metatarsal with no current drainage noted or proximal edema erythema drainage noted. The patient states this started up in the last few days    Assessment:     Possible mild localized soft tissue infection left with no active drainage noted or possible reaction to suture or irritation secondary to the procedure itself. No indications of systemic infection    Plan:     Reviewed the condition and at this time applied Unna boot in order to try to reduce some of the swelling along with Ace wrap and elevation and did place her on cephalexin 500 mg 3 times a day for antibiotics usage. Gave strict instructions if any increased erythema edema or drainage were to noted to let us know right away and we will plan on seen her back again in 1 week

## 2014-10-23 NOTE — Telephone Encounter (Signed)
I called [employer] to check patient's benefits for surgery scheduled for 11/05/2014.  Marjory Lies stated she  Has a $3000 deductible which has been met, $6000 max out of pocket.  Benefits are 60% / 40%.  No pre-cert needed, effective date 04/23/2014 to 04/22/2015.

## 2014-10-30 ENCOUNTER — Encounter: Payer: PRIVATE HEALTH INSURANCE | Admitting: Podiatry

## 2014-10-30 ENCOUNTER — Ambulatory Visit (INDEPENDENT_AMBULATORY_CARE_PROVIDER_SITE_OTHER): Payer: PRIVATE HEALTH INSURANCE

## 2014-10-30 ENCOUNTER — Encounter: Payer: Self-pay | Admitting: Podiatry

## 2014-10-30 ENCOUNTER — Ambulatory Visit (INDEPENDENT_AMBULATORY_CARE_PROVIDER_SITE_OTHER): Payer: PRIVATE HEALTH INSURANCE | Admitting: Podiatry

## 2014-10-30 VITALS — BP 96/55 | HR 81 | Temp 98.8°F | Resp 16

## 2014-10-30 DIAGNOSIS — Z9889 Other specified postprocedural states: Secondary | ICD-10-CM | POA: Diagnosis not present

## 2014-10-30 DIAGNOSIS — M2020 Hallux rigidus, unspecified foot: Secondary | ICD-10-CM | POA: Diagnosis not present

## 2014-10-30 NOTE — Patient Instructions (Signed)
Pre-Operative Instructions  Congratulations, you have decided to take an important step to improving your quality of life.  You can be assured that the doctors of [facility] will be with you every step of the way.  1. Plan to be at the surgery center/hospital at least 1 (one) hour prior to your scheduled time unless otherwise directed by the surgical center/hospital staff.  You must have a responsible adult accompany you, remain during the surgery and drive you home.  Make sure you have directions to the surgical center/hospital and know how to get there on time. 2. For hospital based surgery you will need to obtain a history and physical form from your family physician within 1 month prior to the date of surgery- we will give you a form for you primary physician.  3. We make every effort to accommodate the date you request for surgery.  There are however, times where surgery dates or times have to be moved.  We will contact you as soon as possible if a change in schedule is required.   4. No Aspirin/Ibuprofen for one week before surgery.  If you are on aspirin, any non-steroidal anti-inflammatory medications (Mobic, Aleve, Ibuprofen) you should stop taking it 7 days prior to your surgery.  You make take Tylenol  For pain prior to surgery.  5. Medications- If you are taking daily heart and blood pressure medications, seizure, reflux, allergy, asthma, anxiety, pain or diabetes medications, make sure the surgery center/hospital is aware before the day of surgery so they may notify you which medications to take or avoid the day of surgery. 6. No food or drink after midnight the night before surgery unless directed otherwise by surgical center/hospital staff. 7. No alcoholic beverages 24 hours prior to surgery.  No smoking 24 hours prior to or 24 hours after surgery. 8. Wear loose pants or shorts- loose enough to fit over bandages, boots, and casts. 9. No slip on shoes, sneakers are best. 10. Bring  your boot with you to the surgery center/hospital.  Also bring crutches or a walker if your physician has prescribed it for you.  If you do not have this equipment, it will be provided for you after surgery. 11. If you have not been contracted by the surgery center/hospital by the day before your surgery, call to confirm the date and time of your surgery. 12. Leave-time from work may vary depending on the type of surgery you have.  Appropriate arrangements should be made prior to surgery with your employer. 13. Prescriptions will be provided immediately following surgery by your doctor.  Have these filled as soon as possible after surgery and take the medication as directed. 14. Remove nail polish on the operative foot. 15. Wash the night before surgery.  The night before surgery wash the foot and leg well with the antibacterial soap provided and water paying special attention to beneath the toenails and in between the toes.  Rinse thoroughly with water and dry well with a towel.  Perform this wash unless told not to do so by your physician.  Enclosed: 1 Ice pack (please put in freezer the night before surgery)   1 Hibiclens skin cleaner   Pre-op Instructions  If you have any questions regarding the instructions, do not hesitate to call our office.  [place]: [address] [place], [place] [address] [phone]  [place]: [address] [place], [place] [address] [phone]  [place]: [address]  [place], [place] [address] [phone]  Dr. Richard   Tuchman DPM, Dr. Norman Regal DPM Dr. Richard Sikora DPM, Dr. M. Todd Hyatt DPM, Dr. Kathryn Egerton DPM 

## 2014-10-30 NOTE — Progress Notes (Signed)
Subjective:     Patient ID: Dorothy Campbell, female   DOB: 25-Oct-1980, 34 y.o.   MRN: [id number]  HPI patient presents stating my left foot is doing much better and I'm ready to get my right foot fixed next week   Review of Systems     Objective:   Physical Exam Neurovascular status found to be intact with muscle strength adequate range of motion within normal limits. Patient's noted to have a well-healing surgical site left first MPJ with excellent range of motion of 35 dorsiflexion 25 plantar flexion with no crepitus and has structural bunion with limitation of motion of the first MPJ of the right foot    Assessment:     Doing very well postoperatively left first MPJ surgery with good range of motion and wound edges well coapted and structural bunion and hallux limitus deformity right    Plan:     Reviewed both conditions and at this point I recommended correction of the right foot and I allowed her to read a consent form for correction going over all possible alternative treatments and complications. Patient wants surgery signed consent form and for the left foot I dispensed a anklet reviewed x-ray and allow her to return soft shoe gear

## 2014-11-03 ENCOUNTER — Encounter (HOSPITAL_COMMUNITY): Payer: Self-pay

## 2014-11-03 ENCOUNTER — Emergency Department (HOSPITAL_COMMUNITY)
Admission: EM | Admit: 2014-11-03 | Discharge: 2014-11-03 | Disposition: A | Discharge status: Home / Self Care | Payer: PRIVATE HEALTH INSURANCE | Attending: Emergency Medicine | Admitting: Emergency Medicine

## 2014-11-03 DIAGNOSIS — Z23 Encounter for immunization: Secondary | ICD-10-CM | POA: Diagnosis not present

## 2014-11-03 DIAGNOSIS — IMO0002 Reserved for concepts with insufficient information to code with codable children: Secondary | ICD-10-CM

## 2014-11-03 DIAGNOSIS — Y9389 Activity, other specified: Secondary | ICD-10-CM | POA: Insufficient documentation

## 2014-11-03 DIAGNOSIS — W452XXA Lid of can entering through skin, initial encounter: Secondary | ICD-10-CM | POA: Diagnosis not present

## 2014-11-03 DIAGNOSIS — Y9289 Other specified places as the place of occurrence of the external cause: Secondary | ICD-10-CM | POA: Insufficient documentation

## 2014-11-03 DIAGNOSIS — Z87440 Personal history of urinary (tract) infections: Secondary | ICD-10-CM | POA: Diagnosis not present

## 2014-11-03 DIAGNOSIS — Y998 Other external cause status: Secondary | ICD-10-CM | POA: Diagnosis not present

## 2014-11-03 DIAGNOSIS — Z87442 Personal history of urinary calculi: Secondary | ICD-10-CM | POA: Insufficient documentation

## 2014-11-03 DIAGNOSIS — S61213A Laceration without foreign body of left middle finger without damage to nail, initial encounter: Secondary | ICD-10-CM | POA: Insufficient documentation

## 2014-11-03 DIAGNOSIS — Z79899 Other long term (current) drug therapy: Secondary | ICD-10-CM | POA: Insufficient documentation

## 2014-11-03 DIAGNOSIS — Z7951 Long term (current) use of inhaled steroids: Secondary | ICD-10-CM | POA: Diagnosis not present

## 2014-11-03 MED ORDER — BACITRACIN ZINC 500 UNIT/GM EX OINT
1.0000 "application " | TOPICAL_OINTMENT | Freq: Two times a day (BID) | CUTANEOUS | Status: DC
  Administered 2014-11-03: 1 via TOPICAL

## 2014-11-03 MED ORDER — LIDOCAINE HCL (PF) 1 % IJ SOLN
5.0000 mL | Freq: Once | INTRAMUSCULAR | Status: AC
  Administered 2014-11-03: 5 mL
  Filled 2014-11-03: qty 5

## 2014-11-03 MED ORDER — TETANUS-DIPHTH-ACELL PERTUSSIS 5-2.5-18.5 LF-MCG/0.5 IM SUSP
0.5000 mL | Freq: Once | INTRAMUSCULAR | Status: AC
  Administered 2014-11-03: 0.5 mL via INTRAMUSCULAR
  Filled 2014-11-03: qty 0.5

## 2014-11-03 NOTE — ED Notes (Signed)
Soaking hand in Betadine solution

## 2014-11-03 NOTE — ED Provider Notes (Signed)
CSN: [id number]     Arrival date & time 11/03/14  1356 History  This chart was scribed for non-physician practitioner, Roxy Horseman, PA-C working with Tilden Fossa, MD by Doreatha Martin, ED scribe. This patient was seen in room TR11C/TR11C and the patient's care was started at 4:30 PM    Chief Complaint  Patient presents with  . Laceration   The history is provided by the patient. No language interpreter was used.    HPI Comments: Dorothy Campbell is a 34 y.o. female who presents to the Emergency Department with a chief complaint of a laceration to the left middle digit on the palmar aspect that occurred today after cutting her finger on the edge of a tuna can lid. Bleeding is controlled with pressure. Pt states associated moderate pain. She notes that she is able to move the digit. She denies numbness.   Past Medical History  Diagnosis Date  . Kidney stone   . UTI (urinary tract infection)    Past Surgical History  Procedure Laterality Date  . Breast enhancement surgery  2013   Family History  Problem Relation Age of Onset  . Diabetes Mother   . Diabetes Maternal Grandmother   . Heart disease Maternal Grandfather     CHF  . Diabetes Maternal Grandfather   . Heart disease Paternal Grandfather     MI   Social History  Substance Use Topics  . Smoking status: Never Smoker   . Smokeless tobacco: None  . Alcohol Use: Yes     Comment: occ   OB History    Gravida Para Term Preterm AB TAB SAB Ectopic Multiple Living   2 2             Review of Systems  Musculoskeletal: Positive for arthralgias.  Skin: Positive for wound.  Neurological: Negative for numbness.   Allergies  Review of patient's allergies indicates no known allergies.  Home Medications   Prior to Admission medications   Medication Sig Start Date End Date Taking? Authorizing Provider  cephALEXin (KEFLEX) 500 MG capsule Take 1 capsule (500 mg total) by mouth 3 (three) times daily. 10/23/14   Lenn Sink, DPM   fluticasone (FLONASE) 50 MCG/ACT nasal spray Place 2 sprays into the nose daily. 03/14/12   Terressa Koyanagi, DO  JUNEL 1/20 1-20 MG-MCG tablet Take 1 tablet by mouth daily.  02/08/12   Historical Provider, MD  meperidine (DEMEROL) 50 MG tablet Take 50 mg by mouth every 4 (four) hours as needed for severe pain (1 or 2 tablets every 4-6 hours prn pain).    Lenn Sink, DPM  Multiple Vitamin (MULTIVITAMIN) tablet Take 1 tablet by mouth daily.    Historical Provider, MD  promethazine (PHENERGAN) 25 MG tablet Take 25 mg by mouth every 4 (four) hours as needed for nausea or vomiting.    Kirstie Peri Regal, DPM   BP 107/82 mmHg  Pulse 91  Temp(Src) 97.8 F (36.6 C) (Oral)  Resp 16  Ht  (1.676 m)  Wt 135 lb (61.236 kg)  BMI 21.80 kg/m2  SpO2 99%  LMP 10/21/2014 Physical Exam  Constitutional: She is oriented to person, place, and time. She appears well-developed and well-nourished.  HENT:  Head: Normocephalic and atraumatic.  Eyes: Conjunctivae and EOM are normal. Pupils are equal, round, and reactive to light.  Neck: Normal range of motion. Neck supple.  Cardiovascular: Normal rate.   Pulmonary/Chest: Effort normal. No respiratory distress.  Abdominal: She exhibits no  distension.  Musculoskeletal: Normal range of motion.  ROM and strength left middle finger 5/5. Isolated at all joints.   Neurological: She is alert and oriented to person, place, and time.  Skin: Skin is warm and dry.  2cm laceration to the left middle finger between the PIP and DIP on the palmar aspect. No foreign body. No obvious tendon injury.   Psychiatric: She has a normal mood and affect. Her behavior is normal.  Nursing note and vitals reviewed.   ED Course  Procedures (including critical care time) DIAGNOSTIC STUDIES: Oxygen Saturation is 99% on RA, normal by my interpretation.    COORDINATION OF CARE: 4:34 PM Discussed treatment plan with pt at bedside and pt agreed to plan.   5:20 PM  LACERATION  REPAIR Performed by: Roxy Horseman, PA-C Consent: Verbal consent obtained. Risks and benefits: risks, benefits and alternatives were discussed Patient identity confirmed: provided demographic data Time out performed prior to procedure Prepped and Draped in normal sterile fashion Wound explored Laceration Location: left middle finger, palmar aspect between PIP and DIP Laceration Length: 2cm No Foreign Bodies seen or palpated Anesthesia: Digital block  Local anesthetic: lidocaine 1% without epinephrine Anesthetic total: 5 ml Irrigation method: syringe Amount of cleaning: standard Skin closure: 5-0 Vicryl plus  Number of sutures or staples: 6 sutures  Technique: Simple interrupted  Patient tolerance: Patient tolerated the procedure well with no immediate complications.    MDM   Final diagnoses:  Laceration    Patient with laceration on a tuna fish can. Tetanus shot updated today. Laceration repaired in the emergency department. No evidence of tendon injury. No bony tenderness, doubt fracture. Normal range of motion and strength. Hand was copiously irrigated, and wound repaired without complication. The patient was then placed in a static finger splint, and told to wear this for the next week. She was advised to remove the splint several times a day for range of motion exercises, but in general to keep splint intact to avoid healing with any skin contracture. Wound healing precautions given. Patient understands and agrees with the plan. She is stable and ready for discharge.   I personally performed the services described in this documentation, which was scribed in my presence. The recorded information has been reviewed and is accurate.    Roxy Horseman, PA-C 11/03/14 1907  Tilden Fossa, MD 11/04/14 [phone]

## 2014-11-03 NOTE — ED Notes (Signed)
Onset PTA pt cut left middle finger on can of tuna, site is bleeding.

## 2014-11-03 NOTE — Discharge Instructions (Signed)

## 2014-11-05 DIAGNOSIS — M2011 Hallux valgus (acquired), right foot: Secondary | ICD-10-CM | POA: Diagnosis not present

## 2014-11-08 ENCOUNTER — Telehealth: Payer: Self-pay | Admitting: *Deleted

## 2014-11-08 NOTE — Telephone Encounter (Signed)
I attempted to call patient to see how she was doing.  I left a message for a return call on Monday.

## 2014-11-11 ENCOUNTER — Other Ambulatory Visit: Payer: Self-pay

## 2014-11-15 ENCOUNTER — Ambulatory Visit (INDEPENDENT_AMBULATORY_CARE_PROVIDER_SITE_OTHER): Payer: PRIVATE HEALTH INSURANCE | Admitting: Podiatry

## 2014-11-15 ENCOUNTER — Ambulatory Visit (INDEPENDENT_AMBULATORY_CARE_PROVIDER_SITE_OTHER): Payer: PRIVATE HEALTH INSURANCE

## 2014-11-15 ENCOUNTER — Encounter: Payer: Self-pay | Admitting: Podiatry

## 2014-11-15 VITALS — BP 126/65 | HR 68 | Resp 16

## 2014-11-15 DIAGNOSIS — Z9889 Other specified postprocedural states: Secondary | ICD-10-CM | POA: Diagnosis not present

## 2014-11-15 DIAGNOSIS — M2020 Hallux rigidus, unspecified foot: Secondary | ICD-10-CM | POA: Diagnosis not present

## 2014-11-17 NOTE — Progress Notes (Signed)
Subjective:     Patient ID: Dorothy Campbell, female   DOB: May 28, 1980, 34 y.o.   MRN: [id number]  HPI patient states I'm doing really well with my right foot and my left foot also feels good but there is some swelling on top   Review of Systems     Objective:   Physical Exam Neurovascular status intact muscle strength adequate excellent range of motion first MPJ right with wound edges well coapted and mild edema around the left first metatarsal where she's been more active and wearing different types of shoes    Assessment:     Doing well post surgical foot right with possible overactivity left creating mild to moderate swelling    Plan:     H&P condition reviewed and have recommended for the right foot to gradually increase activity but continue immobilization for another 2-3 weeks and for the left to use compression. Reviewed x-rays of the right foot and reappoint in 4 weeks or if needed

## 2014-12-06 ENCOUNTER — Ambulatory Visit (INDEPENDENT_AMBULATORY_CARE_PROVIDER_SITE_OTHER): Payer: PRIVATE HEALTH INSURANCE | Admitting: Podiatry

## 2014-12-06 ENCOUNTER — Other Ambulatory Visit: Payer: Self-pay | Admitting: Podiatry

## 2014-12-06 ENCOUNTER — Ambulatory Visit: Payer: PRIVATE HEALTH INSURANCE

## 2014-12-06 ENCOUNTER — Ambulatory Visit (INDEPENDENT_AMBULATORY_CARE_PROVIDER_SITE_OTHER): Payer: PRIVATE HEALTH INSURANCE

## 2014-12-06 ENCOUNTER — Encounter: Payer: Self-pay | Admitting: Podiatry

## 2014-12-06 VITALS — BP 103/58 | HR 52 | Resp 16

## 2014-12-06 DIAGNOSIS — M2020 Hallux rigidus, unspecified foot: Secondary | ICD-10-CM | POA: Diagnosis not present

## 2014-12-06 DIAGNOSIS — Z9889 Other specified postprocedural states: Secondary | ICD-10-CM

## 2014-12-06 DIAGNOSIS — M2011 Hallux valgus (acquired), right foot: Secondary | ICD-10-CM

## 2014-12-08 NOTE — Progress Notes (Signed)
Subjective:     Patient ID: Dorothy Campbell, female   DOB: 08/28/1980, 34 y.o.   MRN: [id number]  HPI patient states I'm doing real well with both my feet and I'm able to walk distances with not significant pain   Review of Systems     Objective:   Physical Exam Neurovascular status intact with patient noted to have good range of motion of the first MPJ bilateral with wound edges that are well coapted and mild edema right over left as it is a more recent procedure    Assessment:     Doing well post osteotomy first metatarsal bilateral    Plan:     H&P condition reviewed and x-rays reviewed with patient. Patient is going to increase activity levels at this time but still not jump on her foot or do any running and will gradually do more activity over the next 4 weeks and be reevaluated. Earlier if any issues should occur

## 2015-02-06 ENCOUNTER — Ambulatory Visit (INDEPENDENT_AMBULATORY_CARE_PROVIDER_SITE_OTHER): Payer: PRIVATE HEALTH INSURANCE

## 2015-02-06 ENCOUNTER — Ambulatory Visit (INDEPENDENT_AMBULATORY_CARE_PROVIDER_SITE_OTHER): Payer: PRIVATE HEALTH INSURANCE | Admitting: Podiatry

## 2015-02-06 ENCOUNTER — Encounter: Payer: Self-pay | Admitting: Podiatry

## 2015-02-06 DIAGNOSIS — M2020 Hallux rigidus, unspecified foot: Secondary | ICD-10-CM | POA: Diagnosis not present

## 2015-02-06 DIAGNOSIS — Z9889 Other specified postprocedural states: Secondary | ICD-10-CM | POA: Diagnosis not present

## 2015-02-07 NOTE — Progress Notes (Signed)
Subjective:     Patient ID: Dorothy Campbell, female   DOB: 01/29/1981, 34 y.o.   MRN: [id number]  HPI patient states I'm doing really well with diminishment of discomfort but still have some pain in my right foot was swelling if I do to much   Review of Systems     Objective:   Physical Exam Neurovascular status intact negative Homans sign noted with excellent range of motion first MPJ left over right with some plantar flexion still needs to be improved over time    Assessment:     Significant improvement from hallux limitus condition that was present bilateral    Plan:     Reviewed both conditions and at this time advised on return to normal shoe gear. I did discuss continued range of motion exercises and exercises to improve plantar flexion. Patient will be seen back in 8 weeks and reviewed x-rays of right foot today

## 2015-04-03 ENCOUNTER — Encounter: Payer: Self-pay | Admitting: Podiatry

## 2015-04-03 ENCOUNTER — Ambulatory Visit (INDEPENDENT_AMBULATORY_CARE_PROVIDER_SITE_OTHER): Payer: PRIVATE HEALTH INSURANCE

## 2015-04-03 ENCOUNTER — Ambulatory Visit (INDEPENDENT_AMBULATORY_CARE_PROVIDER_SITE_OTHER): Payer: PRIVATE HEALTH INSURANCE | Admitting: Podiatry

## 2015-04-03 ENCOUNTER — Ambulatory Visit: Payer: PRIVATE HEALTH INSURANCE

## 2015-04-03 VITALS — BP 90/64 | HR 86 | Resp 12

## 2015-04-03 DIAGNOSIS — M2011 Hallux valgus (acquired), right foot: Secondary | ICD-10-CM | POA: Diagnosis not present

## 2015-04-03 DIAGNOSIS — M779 Enthesopathy, unspecified: Secondary | ICD-10-CM

## 2015-04-03 DIAGNOSIS — M2012 Hallux valgus (acquired), left foot: Secondary | ICD-10-CM

## 2015-04-03 NOTE — Progress Notes (Signed)
Subjective:     Patient ID: Dorothy Campbell, female   DOB: 1981/02/15, 35 y.o.   MRN: [id number]  HPI patient states I'm doing well with minimal edema and I'm not getting the sharp pain in my joints anymore   Review of Systems     Objective:   Physical Exam Neurovascular status intact muscle strength adequate with excellent range of motion first MPJ bilateral with no crepitus of the joints or pain currently    Assessment:     Doing well post osteotomy first metatarsal right foot    Plan:     Final x-rays reviewed with patient at this time and scanned for orthotics to try to prevent reoccurrence of symptoms. Patient will be seen back when orthotics are returned

## 2015-04-21 ENCOUNTER — Telehealth: Payer: Self-pay | Admitting: Podiatry

## 2015-04-21 NOTE — Telephone Encounter (Signed)
Left voicemail to call office to re/schedule appt

## 2015-04-30 ENCOUNTER — Ambulatory Visit: Payer: PRIVATE HEALTH INSURANCE | Admitting: *Deleted

## 2015-04-30 DIAGNOSIS — M79673 Pain in unspecified foot: Secondary | ICD-10-CM

## 2015-04-30 NOTE — Patient Instructions (Signed)

## 2015-04-30 NOTE — Progress Notes (Signed)
Patient ID: Dorothy Campbell, female   DOB: 01/29/1981, 35 y.o.   MRN: [id number] Patient presents for orthotic pick up.  Verbal and written break in and wear instructions given.  Patient will follow up in 4 weeks if symptoms worsen or fail to improve.

## 2017-08-05 ENCOUNTER — Ambulatory Visit (INDEPENDENT_AMBULATORY_CARE_PROVIDER_SITE_OTHER): Payer: PRIVATE HEALTH INSURANCE | Admitting: Obstetrics & Gynecology

## 2017-08-05 ENCOUNTER — Encounter: Payer: Self-pay | Admitting: Obstetrics & Gynecology

## 2017-08-05 VITALS — BP 126/84 | Ht 65.5 in | Wt 136.0 lb

## 2017-08-05 DIAGNOSIS — Z3041 Encounter for surveillance of contraceptive pills: Secondary | ICD-10-CM

## 2017-08-05 DIAGNOSIS — Z01419 Encounter for gynecological examination (general) (routine) without abnormal findings: Secondary | ICD-10-CM

## 2017-08-05 MED ORDER — NORETHINDRONE ACET-ETHINYL EST 1-20 MG-MCG PO TABS: 1.0000 | ORAL_TABLET | Freq: Every day | ORAL | 4 refills | Status: DC

## 2017-08-05 NOTE — Progress Notes (Signed)
Dorothy Campbell 1980/03/04 [id number]   History:    37 y.o. G2P2L2 Married.  Son 8 yo, daughter 63 yo.  RP:  Established patient presenting for annual gyn exam   HPI: Well on Junel 1/20.  No breakthrough bleeding.  No pelvic pain.  Normal vaginal secretions.  No pain with intercourse.  Urine normal.  Bowel movements normal.  Breasts status post bilateral augmentation normal.  Body mass index 22.29.  Physically active.  Good nutrition.  Will follow up here for fasting health labs.  Past medical history,surgical history, family history and social history were all reviewed and documented in the EPIC chart.  Gynecologic History Patient's last menstrual period was 07/22/2017. Contraception: OCP (estrogen/progesterone) Last Pap: 01/2015. Results were: Negative/HPV HR neg Last mammogram: Never Bone Density: Never Colonoscopy: Never  Obstetric History OB History  Gravida Para Term Preterm AB Living  _0 SAB TAB Ectopic Multiple Live Births               # Outcome Date GA Lbr Len/2nd Weight Sex Delivery Anes PTL Lv  2 Para           1 Para              ROS: A ROS was performed and pertinent positives and negatives are included in the history.  GENERAL: No fevers or chills. HEENT: No change in vision, no earache, sore throat or sinus congestion. NECK: No pain or stiffness. CARDIOVASCULAR: No chest pain or pressure. No palpitations. PULMONARY: No shortness of breath, cough or wheeze. GASTROINTESTINAL: No abdominal pain, nausea, vomiting or diarrhea, melena or bright red blood per rectum. GENITOURINARY: No urinary frequency, urgency, hesitancy or dysuria. MUSCULOSKELETAL: No joint or muscle pain, no back pain, no recent trauma. DERMATOLOGIC: No rash, no itching, no lesions. ENDOCRINE: No polyuria, polydipsia, no heat or cold intolerance. No recent change in weight. HEMATOLOGICAL: No anemia or easy bruising or bleeding. NEUROLOGIC: No headache, seizures, numbness, tingling or weakness.  PSYCHIATRIC: No depression, no loss of interest in normal activity or change in sleep pattern.     Exam:   BP 126/84   Ht 5' 5.5" (1.664 m)   Wt 136 lb (61.7 kg)   LMP 07/22/2017 Comment: PILL  BMI 22.29 kg/m   Body mass index is 22.29 kg/m.  General appearance : Well developed well nourished female. No acute distress HEENT: Eyes: no retinal hemorrhage or exudates,  Neck supple, trachea midline, no carotid bruits, no thyroidmegaly Lungs: Clear to auscultation, no rhonchi or wheezes, or rib retractions  Heart: Regular rate and rhythm, no murmurs or gallops Breast:Examined in sitting and supine position were symmetrical in appearance, status post bilateral breast augmentation, no palpable masses or tenderness,  no skin retraction, no nipple inversion, no nipple discharge, no skin discoloration, no axillary or supraclavicular lymphadenopathy Abdomen: no palpable masses or tenderness, no rebound or guarding Extremities: no edema or skin discoloration or tenderness  Pelvic: Vulva: Normal             Vagina: No gross lesions or discharge  Cervix: No gross lesions or discharge.  Pap reflex done.  Uterus  AV, normal size, shape and consistency, non-tender and mobile  Adnexa  Without masses or tenderness  Anus: Normal   Assessment/Plan:  37 y.o. female for annual exam   1. Encounter for routine gynecological examination with Papanicolaou smear of cervix Normal gynecologic exam.  Pap reflex done.  Breast exam status  post bilateral augmentation normal.  Will start screening mammogram next year at 47.  Follow-up fasting health labs.  LDL was mildly elevated with very good HDL and normal triglycerides in January 2018.  Continue with low-cholesterol nutrition.  Good body mass index at 22.29. - CBC; Future - Comp Met (CMET); Future - Lipid panel; Future - TSH; Future - VITAMIN D 25 Hydroxy (Vit-D Deficiency, Fractures); Future  2. Encounter for surveillance of contraceptive pills Doing  very well on Junel 1/20 20.  No contraindication to birth control pills.  Same birth control pill represcribed.  Other orders - norethindrone-ethinyl estradiol (JUNEL 1/20) 1-20 MG-MCG tablet; Take 1 tablet by mouth daily.  Princess Bruins MD, 12:20 PM 08/05/2017

## 2017-08-05 NOTE — Addendum Note (Signed)
Addended by: Berna SpareASTILLO, BLANCA A on: 08/05/2017 01:13 PM   Modules accepted: Orders

## 2017-08-05 NOTE — Patient Instructions (Signed)
1. Encounter for routine gynecological examination with Papanicolaou smear of cervix Normal gynecologic exam.  Pap reflex done.  Breast exam status post bilateral augmentation normal.  Will start screening mammogram next year at 66.  Follow-up fasting health labs.  LDL was mildly elevated with very good HDL and normal triglycerides in January 2018.  Continue with low-cholesterol nutrition.  Good body mass index at 22.29. - CBC; Future - Comp Met (CMET); Future - Lipid panel; Future - TSH; Future - VITAMIN D 25 Hydroxy (Vit-D Deficiency, Fractures); Future  2. Encounter for surveillance of contraceptive pills Doing very well on Junel 1/20 20.  No contraindication to birth control pills.  Same birth control pill represcribed.  Other orders - norethindrone-ethinyl estradiol (JUNEL 1/20) 1-20 MG-MCG tablet; Take 1 tablet by mouth daily.  Dorothy Campbell, it was a pleasure seeing you today!  I will inform you of your results as soon as they are available.

## 2017-08-08 LAB — PAP IG W/ RFLX HPV ASCU

## 2017-08-09 ENCOUNTER — Other Ambulatory Visit: Payer: PRIVATE HEALTH INSURANCE

## 2017-08-09 DIAGNOSIS — Z01419 Encounter for gynecological examination (general) (routine) without abnormal findings: Secondary | ICD-10-CM

## 2017-08-10 LAB — TSH: TSH: 3.09 m[IU]/L

## 2017-08-10 LAB — VITAMIN D 25 HYDROXY (VIT D DEFICIENCY, FRACTURES): VIT D 25 HYDROXY: 34 ng/mL (ref 30–100)

## 2017-08-10 LAB — COMPREHENSIVE METABOLIC PANEL
AG RATIO: 1.6 (calc) (ref 1.0–2.5)
ALBUMIN MSPROF: 4.2 g/dL (ref 3.6–5.1)
ALKALINE PHOSPHATASE (APISO): 45 U/L (ref 33–115)
ALT: 12 U/L (ref 6–29)
AST: 15 U/L (ref 10–30)
BUN: 15 mg/dL (ref 7–25)
CHLORIDE: 107 mmol/L (ref 98–110)
CO2: 25 mmol/L (ref 20–32)
Calcium: 9.4 mg/dL (ref 8.6–10.2)
Creat: 0.98 mg/dL (ref 0.50–1.10)
GLOBULIN: 2.6 g/dL (ref 1.9–3.7)
Glucose, Bld: 91 mg/dL (ref 65–99)
POTASSIUM: 4.6 mmol/L (ref 3.5–5.3)
Sodium: 140 mmol/L (ref 135–146)
Total Bilirubin: 0.3 mg/dL (ref 0.2–1.2)
Total Protein: 6.8 g/dL (ref 6.1–8.1)

## 2017-08-10 LAB — CBC
HEMATOCRIT: 41.5 % (ref 35.0–45.0)
Hemoglobin: 14.7 g/dL (ref 11.7–15.5)
MCH: 31.4 pg (ref 27.0–33.0)
MCHC: 35.4 g/dL (ref 32.0–36.0)
MCV: 88.7 fL (ref 80.0–100.0)
MPV: 9.2 fL (ref 7.5–12.5)
PLATELETS: 342 10*3/uL (ref 140–400)
RBC: 4.68 10*6/uL (ref 3.80–5.10)
RDW: 11.9 % (ref 11.0–15.0)
WBC: 5.7 10*3/uL (ref 3.8–10.8)

## 2017-08-10 LAB — LIPID PANEL
Cholesterol: 179 mg/dL (ref <200)
HDL: 44 mg/dL — AB (ref >50)
LDL Cholesterol (Calc): 120 mg/dL (calc) — ABNORMAL HIGH
Non-HDL Cholesterol (Calc): 135 mg/dL (calc) — ABNORMAL HIGH (ref <130)
TRIGLYCERIDES: 60 mg/dL (ref <150)
Total CHOL/HDL Ratio: 4.1 (calc) (ref <5.0)

## 2018-01-16 ENCOUNTER — Ambulatory Visit (INDEPENDENT_AMBULATORY_CARE_PROVIDER_SITE_OTHER): Payer: PRIVATE HEALTH INSURANCE | Admitting: Family Medicine

## 2018-01-16 ENCOUNTER — Encounter: Payer: Self-pay | Admitting: Family Medicine

## 2018-01-16 VITALS — BP 99/67 | HR 67 | Temp 98.3°F | Resp 16 | Ht 66.0 in | Wt 144.0 lb

## 2018-01-16 DIAGNOSIS — E785 Hyperlipidemia, unspecified: Secondary | ICD-10-CM

## 2018-01-16 DIAGNOSIS — R635 Abnormal weight gain: Secondary | ICD-10-CM

## 2018-01-16 DIAGNOSIS — Z7689 Persons encountering health services in other specified circumstances: Secondary | ICD-10-CM

## 2018-01-16 DIAGNOSIS — Z23 Encounter for immunization: Secondary | ICD-10-CM

## 2018-01-16 LAB — LIPID PANEL
Cholesterol: 191 mg/dL (ref 0–200)
HDL: 52 mg/dL (ref >39.00)
LDL CALC: 111 mg/dL — AB (ref 0–99)
NONHDL: 139.23
Total CHOL/HDL Ratio: 4
Triglycerides: 139 mg/dL (ref 0.0–149.0)
VLDL: 27.8 mg/dL (ref 0.0–40.0)

## 2018-01-16 LAB — TSH: TSH: 2.55 u[IU]/mL (ref 0.35–4.50)

## 2018-01-16 NOTE — Progress Notes (Signed)
Patient ID: Dorothy Sectionmanda T Sutley, female  DOB: 09/29/1980, 37 y.o.   MRN: [id number] Patient Care Team    Relationship Specialty Notifications Start End  Natalia LeatherwoodKuneff, Caulder Wehner A, DO PCP - General Family Medicine  01/16/18   Genia DelLavoie, Marie-Lyne, MD Consulting Physician Obstetrics and Gynecology  01/16/18     Chief Complaint  Patient presents with  . Establish Care    Subjective:  Dorothy Campbell is a 37 y.o.  female present for new patient establishment. All past medical history, surgical history, allergies, family history, immunizations, medications and social history were updared in the electronic medical record today. All recent labs, ED visits and hospitalizations within the last year were reviewed.  Mild dyslipidemia: Pt had labs collected in June 2019 at her GYN office, that resulted with elevating LDL from the year prior 114--> 120. She has a fhx heart disease. She is taking fish oil occassionally since her lipid collection. She is exercising routinely and reports she eats a healthy diet. She has gained weight, about 10 pounds that she is concerned about. She endorses eat starchy carbs daily.    Lipid Panel     Component Value Date/Time   CHOL 179 08/09/2017 0845   TRIG 60 08/09/2017 0845   HDL 44 (L) 08/09/2017 0845   CHOLHDL 4.1 08/09/2017 0845   LDLCALC 120 (H) 08/09/2017 0845    Depression screen PHQ 2/9 01/16/2018  Decreased Interest 0  Down, Depressed, Hopeless 0  PHQ - 2 Score 0   No flowsheet data found.  Current Exercise Habits: Structured exercise class, Type of exercise: strength training/weights;walking, Time (Minutes): 30, Frequency (Times/Week): 4, Weekly Exercise (Minutes/Week): 120, Intensity: Moderate Exercise limited by: cardiac condition(s) Fall Risk  01/16/2018  Falls in the past year? 0  Follow up Falls evaluation completed   Immunization History  Administered Date(s) Administered  . Influenza Whole 11/23/2011  . Influenza,inj,Quad PF,6+ Mos 01/16/2018    . Tdap 11/03/2014    No exam data present  Past Medical History:  Diagnosis Date  . Chicken pox   . Hyperlipidemia   . Kidney stone   . UTI (urinary tract infection)    No Known Allergies Past Surgical History:  Procedure Laterality Date  . AUGMENTATION MAMMAPLASTY  2013  . FOOT SURGERY Bilateral 2016/2017   bone spurs   Family History  Problem Relation Age of Onset  . Diabetes Mother   . COPD Mother   . Diabetes Maternal Grandmother   . Heart disease Maternal Grandfather        CHF  . Diabetes Maternal Grandfather   . Heart disease Paternal Grandfather        MI  . Hyperlipidemia Paternal Grandfather   . Hypertension Paternal Grandfather   . Hyperlipidemia Father   . Hypertension Father    Social History   Socioeconomic History  . Marital status: Married    Spouse name: Not on file  . Number of children: Not on file  . Years of education: Not on file  . Highest education level: Not on file  Occupational History  . Not on file  Social Needs  . Financial resource strain: Not on file  . Food insecurity:    Worry: Not on file    Inability: Not on file  . Transportation needs:    Medical: Not on file    Non-medical: Not on file  Tobacco Use  . Smoking status: Never Smoker  . Smokeless tobacco: Never Used  Substance and Sexual Activity  .  Alcohol use: Not Currently  . Drug use: Never  . Sexual activity: Yes    Partners: Male    Birth control/protection: Pill    Comment: 1st intercourse- 21, married- 17 yrs   Lifestyle  . Physical activity:    Days per week: Not on file    Minutes per session: Not on file  . Stress: Not on file  Relationships  . Social connections:    Talks on phone: Not on file    Gets together: Not on file    Attends religious service: Not on file    Active member of club or organization: Not on file    Attends meetings of clubs or organizations: Not on file    Relationship status: Not on file  . Intimate partner violence:     Fear of current or ex partner: Not on file    Emotionally abused: Not on file    Physically abused: Not on file    Forced sexual activity: Not on file  Other Topics Concern  . Not on file  Social History Narrative   Safety:      -Wears a bicycle helmet riding a bike: Yes     -smoke alarm in the home:Yes     - wears seatbelt: Yes     - Feels safe in their relationships: Yes      Work or School: B.S. Training and development officer,  works in Airline pilot for heating and air   Home Situation: living with spouse and 2 children    Spiritual Beliefs: christian   Lifestyle: 2x per week goes to gym for cardio and weights, healthy diet         Allergies as of 01/16/2018   No Known Allergies     Medication List        Accurate as of 01/16/18  2:46 PM. Always use your most recent med list.          Biotin 10 MG Caps Take by mouth.   doxylamine (Sleep) 25 MG tablet Commonly known as:  UNISOM Take 25 mg by mouth at bedtime as needed.   Fish Oil 1000 MG Caps Take by mouth.   multivitamin tablet Take 1 tablet by mouth daily.   norethindrone-ethinyl estradiol 1-20 MG-MCG tablet Commonly known as:  MICROGESTIN,JUNEL,LOESTRIN Take 1 tablet by mouth daily.       All past medical history, surgical history, allergies, family history, immunizations andmedications were updated in the EMR today and reviewed under the history and medication portions of their EMR.    No results found for this or any previous visit (from the past 2160 hour(s)).  No results found.   ROS: 14 pt review of systems performed and negative (unless mentioned in an HPI)  Objective: BP 99/67 (BP Location: Left Arm, Patient Position: Sitting, Cuff Size: Normal)   Pulse 67   Temp 98.3 F (36.8 C) (Oral)   Resp 16   Ht 5\' 6"  (1.676 m)   Wt 144 lb (65.3 kg)   LMP 01/02/2018   SpO2 99%   BMI 23.24 kg/m  Gen: Afebrile. No acute distress. Nontoxic in appearance, well-developed, well-nourished,  Very pleasant caucasian female.  HENT:  AT. [place]. MMM Eyes:Pupils Equal Round Reactive to light, Extraocular movements intact,  Conjunctiva without redness, discharge or icterus. Neck/lymp/endocrine: Supple,no lymphadenopathy, no thyromegaly CV: RRR no murmur, no edema, +2/4 P posterior tibialis pulses.  Chest: CTAB, no wheeze, rhonchi or crackles. Abd: Soft. NTND. BS presetn. no Masses palpated. No hepatosplenomegaly. No rebound  tenderness or guarding. Skin: no rashes, purpura or petechiae. Warm and well-perfused. Skin intact. Neuro/Msk:  Normal gait. PERLA. EOMi. Alert. Oriented x3.   Psych: Normal affect, dress and demeanor. Normal speech. Normal thought content and judgment.   Assessment/plan: CHERRIL HETT is a 37 y.o. female present for est.  Dyslipidemia - mild/Weight gain - Discussed lipids actually look pretty good. She does have a Fhx heart disease, advise keeping LDL < 130. Currently at goal. - diet low in saturated fats and sugar. Exercise > 150 min week.  - Can continue fish oil as she has been taking.  - TSH, Lipid collected today Need for immunization against influenza - Flu Vaccine QUAD 36+ mos IM   Return in about 7 months (around 08/08/2018) for CPE.   Note is dictated utilizing voice recognition software. Although note has been proof read prior to signing, occasional typographical errors still can be missed. If any questions arise, please do not hesitate to call for verification.  Electronically signed by: Felix Pacini, DO [facility] [place]

## 2018-01-16 NOTE — Patient Instructions (Addendum)
Preventing High Cholesterol Cholesterol is a waxy, fat-like substance that your body needs in small amounts. Your liver makes all the cholesterol that your body needs. Having high cholesterol (hypercholesterolemia) increases your risk for heart disease and stroke. Extra (excess) cholesterol comes from the food you eat, such as animal-based fat (saturated fat) from meat and some dairy products. High cholesterol can often be prevented with diet and lifestyle changes. If you already have high cholesterol, you can control it with diet and lifestyle changes, as well as medicine. What nutrition changes can be made?  Eat less saturated fat. Foods that contain saturated fat include red meat and some dairy products.  Avoid processed meats, like bacon and lunch meats.  Avoid trans fats, which are found in margarine and some baked goods.  Avoid foods and beverages that have added sugars.  Eat more fruits, vegetables, and whole grains.  Choose healthy sources of protein, such as fish, poultry, and nuts.  Choose healthy sources of fat, such as: ? Nuts. ? Vegetable oils, especially olive oil. ? Fish that have healthy fats (omega-3 fatty acids), such as mackerel or salmon. What lifestyle changes can be made?  Lose weight if you are overweight. Losing 5-10 lb (2.3-4.5 kg) can help prevent or control high cholesterol and reduce your risk for diabetes and high blood pressure. Ask your health care provider to help you with a diet and exercise plan to safely lose weight.  Get enough exercise. Do at least 150 minutes of moderate-intensity exercise each week. ? You could do this in short exercise sessions several times a day, or you could do longer exercise sessions a few times a week. For example, you could take a brisk 10-minute walk or bike ride, 3 times a day, for 5 days a week.  Do not smoke. If you need help quitting, ask your health care provider.  Limit your alcohol intake. If you drink alcohol,  limit alcohol intake to no more than 1 drink a day for nonpregnant women and 2 drinks a day for men. One drink equals 12 oz of beer, 5 oz of wine, or 1 oz of hard liquor. Why are these changes important? If you have high cholesterol, deposits (plaques) may build up on the walls of your blood vessels. Plaques make the arteries narrower and stiffer, which can restrict or block blood flow and cause blood clots to form. This greatly increases your risk for heart attack and stroke. Making diet and lifestyle changes can reduce your risk for these life-threatening conditions. What can I do to lower my risk?  Manage your risk factors for high cholesterol. Talk with your health care provider about all of your risk factors and how to lower your risk.  Manage other conditions that you have, such as diabetes or high blood pressure (hypertension).  Have your cholesterol checked at regular intervals.  Keep all follow-up visits as told by your health care provider. This is important. How is this treated? In addition to diet and lifestyle changes, your health care provider may recommend medicines to help lower cholesterol, such as a medicine to reduce the amount of cholesterol made in your liver. You may need medicine if:  Diet and lifestyle changes do not lower your cholesterol enough.  You have high cholesterol and other risk factors for heart disease or stroke.  Take over-the-counter and prescription medicines only as told by your health care provider. Where to find more information:  American Heart Association: 1122334455.jsp  National Heart, Lung, and  Blood Institute: http://hood.com/ Summary  High cholesterol increases your risk for heart disease and stroke. By keeping your cholesterol level low, you can reduce your risk for these conditions.  Diet and lifestyle changes  are the most important steps in preventing high cholesterol.  Work with your health care provider to manage your risk factors, and have your blood tested regularly. This information is not intended to replace advice given to you by your health care provider. Make sure you discuss any questions you have with your health care provider. Document Released: 02/23/2015 Document Revised: 10/18/2015 Document Reviewed: 10/18/2015 Elsevier Interactive Patient Education  [employer].   Please help [place] help you:  We are honored you have chosen [facility] for your Primary Care home. Below you will find basic instructions that you may need to access in the future. Please help [place] help you by reading the instructions, which cover many of the frequent questions we experience.   Prescription refills and request:  -In order to allow more efficient response time, please call your pharmacy for all refills. They will forward the request electronically to [place]. This allows for the quickest possible response. Request left on a nurse line can take longer to refill, since these are checked as time allows between office patients and other phone calls.  - refill request can take up to 3-5 working days to complete.  - If request is sent electronically and request is appropiate, it is usually completed in 1-2 business days.  - all patients will need to be seen routinely for all chronic medical conditions requiring prescription medications (see follow-up below). If you are overdue for follow up on your condition, you will be asked to make an appointment and we will call in enough medication to cover you until your appointment (up to 30 days).  - all controlled substances will require a face to face visit to request/refill.  - if you desire your prescriptions to go through a new pharmacy, and have an active script at original pharmacy, you will need to call your pharmacy and have scripts transferred to new pharmacy.  This is completed between the pharmacy locations and not by your provider.    Results: If any images or labs were ordered, it can take up to 1 week to get results depending on the test ordered and the lab/facility running and resulting the test. - Normal or stable results, which do not need further discussion, may be released to your mychart immediately with attached note to you. A call may not be generated for normal results. Please make certain to sign up for mychart. If you have questions on how to activate your mychart you can call the front office.  - If your results need further discussion, our office will attempt to contact you via phone, and if unable to reach you after 2 attempts, we will release your abnormal result to your mychart with instructions.  - All results will be automatically released in mychart after 1 week.  - Your provider will provide you with explanation and instruction on all relevant material in your results. Please keep in mind, results and labs may appear confusing or abnormal to the untrained eye, but it does not mean they are actually abnormal for you personally. If you have any questions about your results that are not covered, or you desire more detailed explanation than what was provided, you should make an appointment with your provider to do so.   Our office handles many  outgoing and incoming calls daily. If we have not contacted you within 1 week about your results, please check your mychart to see if there is a message first and if not, then contact our office.  In helping with this matter, you help decrease call volume, and therefore allow us to be able to respond to patients needs more efficiently.   Acute office visits (sick visit):  An acute visit is intended for a new problem and are scheduled in shorter time slots to allow schedule openings for patients with new problems. This is the appropriate visit to discuss a new problem. Problems will not be addressed by  phone call or Echart message. Appointment is needed if requesting treatment. In order to provide you with excellent quality medical care with proper time for you to explain your problem, have an exam and receive treatment with instructions, these appointments should be limited to one new problem per visit. If you experience a new problem, in which you desire to be addressed, please make an acute office visit, we save openings on the schedule to accommodate you. Please do not save your new problem for any other type of visit, let us take care of it properly and quickly for you.   Follow up visits:  Depending on your condition(s) your provider will need to see you routinely in order to provide you with quality care and prescribe medication(s). Most chronic conditions (Example: hypertension, Diabetes, depression/anxiety... etc), require visits a couple times a year. Your provider will instruct you on proper follow up for your personal medical conditions and history. Please make certain to make follow up appointments for your condition as instructed. Failing to do so could result in lapse in your medication treatment/refills. If you request a refill, and are overdue to be seen on a condition, we will always provide you with a 30 day script (once) to allow you time to schedule.    Medicare wellness (well visit): - we have a wonderful Nurse Selena Batten(Kim), that will meet with you and provide you will yearly medicare wellness visits. These visits should occur yearly (can not be scheduled less than 1 calendar year apart) and cover preventive health, immunizations, advance directives and screenings you are entitled to yearly through your medicare benefits. Do not miss out on your entitled benefits, this is when medicare will pay for these benefits to be ordered for you.  These are strongly encouraged by your provider and is the appropriate type of visit to make certain you are up to date with all preventive health benefits. If  you have not had your medicare wellness exam in the last 12 months, please make certain to schedule one by calling the office and schedule your medicare wellness with Selena BattenKim as soon as possible.   Yearly physical (well visit):  - Adults are recommended to be seen yearly for physicals. Check with your insurance and date of your last physical, most insurances require one calendar year between physicals. Physicals include all preventive health topics, screenings, medical exam and labs that are appropriate for gender/age and history. You may have fasting labs needed at this visit. This is a well visit (not a sick visit), new problems should not be covered during this visit (see acute visit).  - Pediatric patients are seen more frequently when they are younger. Your provider will advise you on well child visit timing that is appropriate for your their age. - This is not a medicare wellness visit. Medicare wellness exams do not have  an exam portion to the visit. Some medicare companies allow for a physical, some do not allow a yearly physical. If your medicare allows a yearly physical you can schedule the medicare wellness with our nurse Selena Batten and have your physical with your provider after, on the same day. Please check with insurance for your full benefits.   Late Policy/No Shows:  - all new patients should arrive 15-30 minutes earlier than appointment to allow [place] time  to  obtain all personal demographics,  insurance information and for you to complete office paperwork. - All established patients should arrive 10-15 minutes earlier than appointment time to update all information and be checked in .  - In our best efforts to run on time, if you are late for your appointment you will be asked to either reschedule or if able, we will work you back into the schedule. There will be a wait time to work you back in the schedule,  depending on availability.  - If you are unable to make it to your appointment as  scheduled, please call 24 hours ahead of time to allow [place] to fill the time slot with someone else who needs to be seen. If you do not cancel your appointment ahead of time, you may be charged a no show fee.

## 2018-01-17 ENCOUNTER — Telehealth: Payer: Self-pay | Admitting: *Deleted

## 2018-01-17 NOTE — Telephone Encounter (Signed)
Patient notified of her results and provider recommendations.  Please inform patient the following information: Her good cholesterol came up to 52 and her bad cholesterol went down to 111- These levels are really good. Her thyroid is functioning normal. Continue the fish oil, exercise and diet modifications as we discussed at her appt.   Results not in triage basket.

## 2018-06-29 ENCOUNTER — Other Ambulatory Visit: Payer: Self-pay | Admitting: Obstetrics & Gynecology

## 2018-06-29 NOTE — Telephone Encounter (Signed)
Annual exam scheduled on 08/07/18

## 2018-08-07 ENCOUNTER — Other Ambulatory Visit: Payer: Self-pay

## 2018-08-07 ENCOUNTER — Ambulatory Visit (INDEPENDENT_AMBULATORY_CARE_PROVIDER_SITE_OTHER): Payer: Commercial Managed Care - PPO | Admitting: Obstetrics & Gynecology

## 2018-08-07 ENCOUNTER — Encounter: Payer: Self-pay | Admitting: Obstetrics & Gynecology

## 2018-08-07 VITALS — BP 114/72 | Ht 65.5 in | Wt 135.0 lb

## 2018-08-07 DIAGNOSIS — Z01419 Encounter for gynecological examination (general) (routine) without abnormal findings: Secondary | ICD-10-CM | POA: Diagnosis not present

## 2018-08-07 DIAGNOSIS — Z3041 Encounter for surveillance of contraceptive pills: Secondary | ICD-10-CM | POA: Diagnosis not present

## 2018-08-07 MED ORDER — NORETHINDRONE ACET-ETHINYL EST 1-20 MG-MCG PO TABS: 1.0000 | ORAL_TABLET | Freq: Every day | ORAL | 4 refills | Status: DC

## 2018-08-07 NOTE — Progress Notes (Signed)
Dorothy Campbell 03/13/1980 [id number]   History:    38 y.o. G2P2L2 Married.  Son is 40 yo, daughter is 658 yo.  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  RP:  Established patient presenting for annual gyn exam   HPI: Well on Junel 1/20.  No breakthrough bleeding.  No pelvic pain.  No pain with intercourse.  Urine and bowel movements normal.  Breast normal.  Body mass index good at 22.12.  Very fit and eating well.  Health labs with family physician.  Past medical history,surgical history, family history and social history were all reviewed and documented in the EPIC chart.  Gynecologic History Patient's last menstrual period was 07/24/2018. Contraception: OCP (estrogen/progesterone) Last Pap: 07/2017. Results were: Negative Last mammogram: Never.  Will schedule now.  Bone Density: Never Colonoscopy: Never  Obstetric History OB History  Gravida Para Term Preterm AB Living  2 2       2   SAB TAB Ectopic Multiple Live Births               # Outcome Date GA Lbr Len/2nd Weight Sex Delivery Anes PTL Lv  2 Para           1 Para              ROS: A ROS was performed and pertinent positives and negatives are included in the history.  GENERAL: No fevers or chills. HEENT: No change in vision, no earache, sore throat or sinus congestion. NECK: No pain or stiffness. CARDIOVASCULAR: No chest pain or pressure. No palpitations. PULMONARY: No shortness of breath, cough or wheeze. GASTROINTESTINAL: No abdominal pain, nausea, vomiting or diarrhea, melena or bright red blood per rectum. GENITOURINARY: No urinary frequency, urgency, hesitancy or dysuria. MUSCULOSKELETAL: No joint or muscle pain, no back pain, no recent trauma. DERMATOLOGIC: No rash, no itching, no lesions. ENDOCRINE: No polyuria, polydipsia, no heat or cold intolerance. No recent change in weight. HEMATOLOGICAL: No anemia or easy bruising or bleeding. NEUROLOGIC: No headache, seizures, numbness, tingling or weakness. PSYCHIATRIC:  No depression, no loss of interest in normal activity or change in sleep pattern.     Exam:   BP 114/72    Ht 5' 5.5" (1.664 m)    Wt 135 lb (61.2 kg)    LMP 07/24/2018 Comment: JUNEL   BMI 22.12 kg/m   Body mass index is 22.12 kg/m.  General appearance : Well developed well nourished female. No acute distress HEENT: Eyes: no retinal hemorrhage or exudates,  Neck supple, trachea midline, no carotid bruits, no thyroidmegaly Lungs: Clear to auscultation, no rhonchi or wheezes, or rib retractions  Heart: Regular rate and rhythm, no murmurs or gallops Breast:Examined in sitting and supine position were symmetrical in appearance, no palpable masses or tenderness,  no skin retraction, no nipple inversion, no nipple discharge, no skin discoloration, no axillary or supraclavicular lymphadenopathy Abdomen: no palpable masses or tenderness, no rebound or guarding Extremities: no edema or skin discoloration or tenderness  Pelvic: Vulva: Normal             Vagina: No gross lesions or discharge  Cervix: No gross lesions or discharge  Uterus  RV, normal size, shape and consistency, non-tender and mobile  Adnexa  Without masses or tenderness  Anus: Normal   Assessment/Plan:  38 y.o. female for annual exam   1. Well female exam with routine gynecological exam Normal gynecologic exam.  Pap test negative in June 2019, no indication to repeat this year.  Breast exam normal.  Will schedule her for screening mammogram at the breast center now.  Good body mass index at 22.12.  Continue with fitness and healthy nutrition.  Health labs with family physician.  2. Encounter for surveillance of contraceptive pills Well on Junel 1/20 for contraception.  No contraindication to birth control pills.  Prescription sent to pharmacy.  Other orders - norethindrone-ethinyl estradiol (JUNEL 1/20) 1-20 MG-MCG tablet; Take 1 tablet by mouth daily.  Princess Bruins MD, 11:30 AM 08/07/2018

## 2018-08-07 NOTE — Patient Instructions (Signed)
1. Well female exam with routine gynecological exam Normal gynecologic exam.  Pap test negative in June 2019, no indication to repeat this year.  Breast exam normal.  Will schedule her for screening mammogram at the breast center now.  Good body mass index at 22.12.  Continue with fitness and healthy nutrition.  Health labs with family physician.  2. Encounter for surveillance of contraceptive pills Well on Junel 1/20 for contraception.  No contraindication to birth control pills.  Prescription sent to pharmacy.  Other orders - norethindrone-ethinyl estradiol (JUNEL 1/20) 1-20 MG-MCG tablet; Take 1 tablet by mouth daily.  Dorothy Campbell, it was a pleasure seeing you today!

## 2018-08-09 ENCOUNTER — Other Ambulatory Visit: Payer: Self-pay | Admitting: Obstetrics & Gynecology

## 2018-08-09 DIAGNOSIS — Z1231 Encounter for screening mammogram for malignant neoplasm of breast: Secondary | ICD-10-CM

## 2018-08-15 ENCOUNTER — Other Ambulatory Visit: Payer: Self-pay

## 2018-08-15 ENCOUNTER — Encounter: Payer: Self-pay | Admitting: Family Medicine

## 2018-08-15 ENCOUNTER — Ambulatory Visit (INDEPENDENT_AMBULATORY_CARE_PROVIDER_SITE_OTHER): Payer: PRIVATE HEALTH INSURANCE | Admitting: Family Medicine

## 2018-08-15 VITALS — BP 106/73 | HR 57 | Temp 98.0°F | Resp 17 | Ht 65.5 in | Wt 136.1 lb

## 2018-08-15 DIAGNOSIS — Z130 Encounter for screening for diseases of the blood and blood-forming organs and certain disorders involving the immune mechanism: Secondary | ICD-10-CM

## 2018-08-15 DIAGNOSIS — Z79899 Other long term (current) drug therapy: Secondary | ICD-10-CM

## 2018-08-15 DIAGNOSIS — Z0000 Encounter for general adult medical examination without abnormal findings: Secondary | ICD-10-CM

## 2018-08-15 DIAGNOSIS — E785 Hyperlipidemia, unspecified: Secondary | ICD-10-CM | POA: Insufficient documentation

## 2018-08-15 DIAGNOSIS — Z131 Encounter for screening for diabetes mellitus: Secondary | ICD-10-CM

## 2018-08-15 LAB — COMPREHENSIVE METABOLIC PANEL
ALT: 9 U/L (ref 0–35)
AST: 12 U/L (ref 0–37)
Albumin: 4.3 g/dL (ref 3.5–5.2)
Alkaline Phosphatase: 44 U/L (ref 39–117)
BUN: 13 mg/dL (ref 6–23)
CO2: 28 mEq/L (ref 19–32)
Calcium: 9.1 mg/dL (ref 8.4–10.5)
Chloride: 105 mEq/L (ref 96–112)
Creatinine, Ser: 0.8 mg/dL (ref 0.40–1.20)
GFR: 79.29 mL/min (ref >60.00)
Glucose, Bld: 90 mg/dL (ref 70–99)
Potassium: 4.7 mEq/L (ref 3.5–5.1)
Sodium: 138 mEq/L (ref 135–145)
Total Bilirubin: 0.4 mg/dL (ref 0.2–1.2)
Total Protein: 6.6 g/dL (ref 6.0–8.3)

## 2018-08-15 LAB — CBC
HCT: 41.5 % (ref 36.0–46.0)
Hemoglobin: 13.9 g/dL (ref 12.0–15.0)
MCHC: 33.4 g/dL (ref 30.0–36.0)
MCV: 94.6 fl (ref 78.0–100.0)
Platelets: 275 10*3/uL (ref 150.0–400.0)
RBC: 4.39 Mil/uL (ref 3.87–5.11)
RDW: 13.6 % (ref 11.5–15.5)
WBC: 6.1 10*3/uL (ref 4.0–10.5)

## 2018-08-15 LAB — HEMOGLOBIN A1C: Hgb A1c MFr Bld: 5.4 % (ref 4.6–6.5)

## 2018-08-15 NOTE — Patient Instructions (Signed)

## 2018-08-15 NOTE — Progress Notes (Signed)
Patient ID: Dorothy Campbell, female  DOB: June 19, 1980, 38 y.o.   MRN: [id number] Patient Care Team    Relationship Specialty Notifications Start End  Ma Hillock, DO PCP - General Family Medicine  01/16/18   Princess Bruins, MD Consulting Physician Obstetrics and Gynecology  01/16/18     Chief Complaint  Patient presents with  . Annual Exam    Pt is doing well with no complaints. Pt saw GYN last week, last pap 07/2017. Mammogram scheduled for 09/22/2018. Pt had coffee with creamer this AM     Subjective:  Dorothy Campbell is a 38 y.o.  Female  present for CPE. All past medical history, surgical history, allergies, family history, immunizations, medications and social history were updated in the electronic medical record today. All recent labs, ED visits and hospitalizations within the last year were reviewed.  Health maintenance:  Colonoscopy: no fhx.  Complete age 28 Mammogram: No fhx. scheduled for 09/22/2018 through  gyn.  Cervical cancer screening: last pap: 08/05/2017,completed by: Dellis Filbert Immunizations: tdap UTD 10/2014, Influenza UTD 2019 (encouraged yearly), Infectious disease screening: HIV completed 2012 DEXA: N/A Assistive device: None Oxygen VEH:MCNO Patient has a Dental home. Hospitalizations/ED visits: reviewed  Depression screen [facility] 2/9 08/15/2018 01/16/2018  Decreased Interest 0 0  Down, Depressed, Hopeless 0 0  PHQ - 2 Score 0 0   No flowsheet data found.   Immunization History  Administered Date(s) Administered  . Influenza Whole 11/23/2011  . Influenza,inj,Quad PF,6+ Mos 01/16/2018  . Tdap 11/03/2014    Past Medical History:  Diagnosis Date  . Chicken pox   . Hyperlipidemia   . Kidney stone   . UTI (urinary tract infection)    No Known Allergies Past Surgical History:  Procedure Laterality Date  . AUGMENTATION MAMMAPLASTY  2013  . FOOT SURGERY Bilateral 2016/2017   bone spurs   Family History  Problem Relation Age of Onset  .  Diabetes Mother   . COPD Mother   . Diabetes Maternal Grandmother   . Heart disease Maternal Grandfather        CHF  . Diabetes Maternal Grandfather   . Heart disease Paternal Grandfather        MI  . Hyperlipidemia Paternal Grandfather   . Hypertension Paternal Grandfather   . Hyperlipidemia Father   . Hypertension Father    Social History   Social History Narrative   Safety:      -Wears a bicycle helmet riding a bike: Yes     -smoke alarm in the home:Yes     - wears seatbelt: Yes     - Feels safe in their relationships: Yes      Work or School: B.S. 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 If you have any questions, ask your nurse or doctor.        doxylamine (Sleep) 25 MG tablet Commonly known as: UNISOM Take 25 mg by mouth at bedtime as needed.   Fish Oil 1000 MG Caps Take by mouth.   multivitamin tablet Take 1 tablet by mouth daily.  norethindrone-ethinyl estradiol 1-20 MG-MCG tablet Commonly known as: Junel 1/20 Take 1 tablet by mouth daily.       All past medical history, surgical history, allergies, family history, immunizations andmedications were updated in the EMR today and reviewed under the history and medication portions of their EMR.     No results found for this or any previous visit (from the past 2160 hour(s)).  No results found.   ROS: 14 pt review of systems performed and negative (unless mentioned in an HPI)  Objective: BP 106/73 (BP Location: Left Arm, Patient Position: Sitting, Cuff Size: Normal)   Pulse (!) 57   Temp 98 F (36.7 C) (Oral)   Resp 17   Ht 5' 5.5" (1.664 m)   Wt 136 lb 2 oz (61.7 kg)   LMP 08/15/2018 (Exact Date) Comment: JUNEL   SpO2 98%   BMI 22.31 kg/m  Gen: Afebrile. No acute distress. Nontoxic in appearance, well-developed, well-nourished,  Pleasant caucasian female.  HENT: AT. [place]. Bilateral TM visualized and normal in appearance, normal external auditory canal. MMM, no oral lesions, adequate dentition. Bilateral nares within normal limits. Throat without erythema, ulcerations or exudates. no Cough on exam, no hoarseness on exam. Eyes:Pupils Equal Round Reactive to light, Extraocular movements intact,  Conjunctiva without redness, discharge or icterus. Neck/lymp/endocrine: Supple,no lymphadenopathy, no thyromegaly CV: RRR no murmur, no edema, +2/4 P posterior tibialis pulses. no carotid bruits. No JVD. Chest: CTAB, no wheeze, rhonchi or crackles. normal Respiratory effort. good Air movement. Abd: Soft. flat. NTND. BS present. no Masses palpated. No hepatosplenomegaly. No rebound tenderness or guarding. Skin: no rashes, purpura or petechiae. Warm and well-perfused. Skin intact. Neuro/Msk:  Normal gait. PERLA. EOMi. Alert. Oriented x3.  Cranial nerves II through XII intact. Muscle strength 5/5 upper/lower extremity. DTRs equal bilaterally. Psych: Normal affect, dress and demeanor. Normal speech. Normal thought content and judgment.   No exam data present  Assessment/plan: Dorothy Campbell is a 38 y.o. female present for CPE. Encounter for preventive health examination Patient was encouraged to exercise greater than 150 minutes a week. Patient was encouraged to choose a diet filled with fresh fruits and vegetables, and lean meats. AVS provided to patient today for education/recommendation on gender specific health and safety maintenance. Colonoscopy: no fhx.  Complete age 89 Mammogram: No fhx. scheduled for 09/22/2018 through  gyn.  Cervical cancer screening: last pap: 08/05/2017,completed by: Seymour BarsLavoie Immunizations: tdap UTD 10/2014, Influenza UTD 2019 (encouraged yearly), Infectious disease screening: HIV completed  2012 DEXA: N/A  Return in about 1 year (around 08/15/2019) for CPE (30 min).  Electronically signed by: Dorothy Pacinienee Alvilda Mckenna, DO [facility]

## 2018-09-22 ENCOUNTER — Ambulatory Visit
Admission: RE | Admit: 2018-09-22 | Discharge: 2018-09-22 | Disposition: A | Discharge status: Home / Self Care | Payer: Commercial Managed Care - PPO | Source: Ambulatory Visit | Attending: Obstetrics & Gynecology | Admitting: Obstetrics & Gynecology

## 2018-09-22 ENCOUNTER — Other Ambulatory Visit: Payer: Self-pay

## 2018-09-22 DIAGNOSIS — Z1231 Encounter for screening mammogram for malignant neoplasm of breast: Secondary | ICD-10-CM

## 2019-07-14 ENCOUNTER — Other Ambulatory Visit: Payer: Self-pay | Admitting: Obstetrics & Gynecology

## 2019-07-16 NOTE — Telephone Encounter (Signed)
Patient scheduled on 08/08/19

## 2019-08-08 ENCOUNTER — Other Ambulatory Visit: Payer: Self-pay

## 2019-08-08 ENCOUNTER — Ambulatory Visit (INDEPENDENT_AMBULATORY_CARE_PROVIDER_SITE_OTHER): Payer: Commercial Managed Care - PPO | Admitting: Obstetrics & Gynecology

## 2019-08-08 ENCOUNTER — Encounter: Payer: Self-pay | Admitting: Obstetrics & Gynecology

## 2019-08-08 VITALS — BP 110/72 | Ht 65.5 in | Wt 145.0 lb

## 2019-08-08 DIAGNOSIS — Z3041 Encounter for surveillance of contraceptive pills: Secondary | ICD-10-CM | POA: Diagnosis not present

## 2019-08-08 DIAGNOSIS — Z01419 Encounter for gynecological examination (general) (routine) without abnormal findings: Secondary | ICD-10-CM

## 2019-08-08 MED ORDER — NORETHINDRONE ACET-ETHINYL EST 1-20 MG-MCG PO TABS: 1.0000 | ORAL_TABLET | Freq: Every day | ORAL | 4 refills | Status: DC

## 2019-08-08 NOTE — Patient Instructions (Signed)
1. Well female exam with routine gynecological exam Normal gynecologic exam.  Pap test June 2019 was negative, no indication to repeat this year.  Breast exam normal.  Screening mammogram July 2020 was negative.  Good body mass index at 23.76.  Continue with fitness and healthy nutrition.  2. Encounter for surveillance of contraceptive pills Doing well on low-dose birth control pill with Junel 1/20.  No contraindication to continue.  Other contraceptives and sterilization procedures reviewed with patient.  Decision to continue on birth control pills.  Prescription sent to pharmacy.  Other orders - cholecalciferol (VITAMIN D3) 25 MCG (1000 UNIT) tablet; Take 1,000 Units by mouth daily. - norethindrone-ethinyl estradiol (JUNEL 1/20) 1-20 MG-MCG tablet; Take 1 tablet by mouth daily.  Dorothy Campbell, it was a pleasure seeing you today!

## 2019-08-08 NOTE — Progress Notes (Signed)
Dorothy Campbell Jan 16, 1981 [id number]   History:    39 y.o. G2P2L2 Married.  Son is 25 yo, daughter is 70 yo.  Works in Press photographer for [employer].  RP:  Established patient presenting for annual gyn exam   HPI: Well on Junel 1/20.  No breakthrough bleeding.  No pelvic pain.  No pain with intercourse.  Urine and bowel movements normal.  Breast normal.  Body mass index good at 23.76.  Very fit and eating well.  Health labs with family physician.   Past medical history,surgical history, family history and social history were all reviewed and documented in the EPIC chart.  Gynecologic History Patient's last menstrual period was 08/05/2019.  Obstetric History OB History  Gravida Para Term Preterm AB Living  2 2       2   SAB TAB Ectopic Multiple Live Births               # Outcome Date GA Lbr Len/2nd Weight Sex Delivery Anes PTL Lv  2 Para           1 Para              ROS: A ROS was performed and pertinent positives and negatives are included in the history.  GENERAL: No fevers or chills. HEENT: No change in vision, no earache, sore throat or sinus congestion. NECK: No pain or stiffness. CARDIOVASCULAR: No chest pain or pressure. No palpitations. PULMONARY: No shortness of breath, cough or wheeze. GASTROINTESTINAL: No abdominal pain, nausea, vomiting or diarrhea, melena or bright red blood per rectum. GENITOURINARY: No urinary frequency, urgency, hesitancy or dysuria. MUSCULOSKELETAL: No joint or muscle pain, no back pain, no recent trauma. DERMATOLOGIC: No rash, no itching, no lesions. ENDOCRINE: No polyuria, polydipsia, no heat or cold intolerance. No recent change in weight. HEMATOLOGICAL: No anemia or easy bruising or bleeding. NEUROLOGIC: No headache, seizures, numbness, tingling or weakness. PSYCHIATRIC: No depression, no loss of interest in normal activity or change in sleep pattern.     Exam:   BP 110/72   Ht 5' 5.5" (1.664 m)   Wt 145 lb (65.8 kg)   LMP  08/05/2019 Comment: PILL  BMI 23.76 kg/m   Body mass index is 23.76 kg/m.  General appearance : Well developed well nourished female. No acute distress HEENT: Eyes: no retinal hemorrhage or exudates,  Neck supple, trachea midline, no carotid bruits, no thyroidmegaly Lungs: Clear to auscultation, no rhonchi or wheezes, or rib retractions  Heart: Regular rate and rhythm, no murmurs or gallops Breast:Examined in sitting and supine position were symmetrical in appearance, no palpable masses or tenderness,  no skin retraction, no nipple inversion, no nipple discharge, no skin discoloration, no axillary or supraclavicular lymphadenopathy Abdomen: no palpable masses or tenderness, no rebound or guarding Extremities: no edema or skin discoloration or tenderness  Pelvic: Vulva: Normal             Vagina: No gross lesions or discharge  Cervix: No gross lesions or discharge  Uterus  AV, normal size, shape and consistency, non-tender and mobile  Adnexa  Without masses or tenderness  Anus: Normal   Assessment/Plan:  39 y.o. female for annual exam   1. Well female exam with routine gynecological exam Normal gynecologic exam.  Pap test June 2019 was negative, no indication to repeat this year.  Breast exam normal.  Screening mammogram July 2020 was negative.  Good body mass index at 23.76.  Continue with fitness and healthy nutrition.  2. Encounter for surveillance of contraceptive pills Doing well on low-dose birth control pill with Junel 1/20.  No contraindication to continue.  Other contraceptives and sterilization procedures reviewed with patient.  Decision to continue on birth control pills.  Prescription sent to pharmacy.  Other orders - cholecalciferol (VITAMIN D3) 25 MCG (1000 UNIT) tablet; Take 1,000 Units by mouth daily. - norethindrone-ethinyl estradiol (JUNEL 1/20) 1-20 MG-MCG tablet; Take 1 tablet by mouth daily.  Genia Del MD, 2:41 PM 08/08/2019

## 2019-08-17 ENCOUNTER — Other Ambulatory Visit: Payer: Self-pay | Admitting: Obstetrics & Gynecology

## 2019-08-17 DIAGNOSIS — Z1231 Encounter for screening mammogram for malignant neoplasm of breast: Secondary | ICD-10-CM

## 2019-09-24 ENCOUNTER — Ambulatory Visit
Admission: RE | Admit: 2019-09-24 | Discharge: 2019-09-24 | Disposition: A | Discharge status: Home / Self Care | Payer: Commercial Managed Care - PPO | Source: Ambulatory Visit | Attending: Obstetrics & Gynecology | Admitting: Obstetrics & Gynecology

## 2019-09-24 ENCOUNTER — Other Ambulatory Visit: Payer: Self-pay

## 2019-09-24 DIAGNOSIS — Z1231 Encounter for screening mammogram for malignant neoplasm of breast: Secondary | ICD-10-CM

## 2019-09-26 ENCOUNTER — Other Ambulatory Visit: Payer: Self-pay | Admitting: Obstetrics & Gynecology

## 2019-09-26 DIAGNOSIS — R928 Other abnormal and inconclusive findings on diagnostic imaging of breast: Secondary | ICD-10-CM

## 2019-10-10 ENCOUNTER — Other Ambulatory Visit: Payer: Self-pay

## 2019-10-10 ENCOUNTER — Other Ambulatory Visit: Payer: Self-pay | Admitting: Obstetrics & Gynecology

## 2019-10-10 ENCOUNTER — Ambulatory Visit
Admission: RE | Admit: 2019-10-10 | Discharge: 2019-10-10 | Disposition: A | Discharge status: Home / Self Care | Payer: Commercial Managed Care - PPO | Source: Ambulatory Visit | Attending: Obstetrics & Gynecology | Admitting: Obstetrics & Gynecology

## 2019-10-10 DIAGNOSIS — R928 Other abnormal and inconclusive findings on diagnostic imaging of breast: Secondary | ICD-10-CM

## 2020-03-25 ENCOUNTER — Ambulatory Visit (INDEPENDENT_AMBULATORY_CARE_PROVIDER_SITE_OTHER): Payer: Commercial Managed Care - PPO | Admitting: Family Medicine

## 2020-03-25 ENCOUNTER — Encounter: Payer: Self-pay | Admitting: Family Medicine

## 2020-03-25 ENCOUNTER — Other Ambulatory Visit: Payer: Self-pay

## 2020-03-25 VITALS — BP 108/72 | HR 68 | Temp 97.9°F | Resp 16 | Ht 65.75 in | Wt 138.6 lb

## 2020-03-25 DIAGNOSIS — E7800 Pure hypercholesterolemia, unspecified: Secondary | ICD-10-CM | POA: Diagnosis not present

## 2020-03-25 DIAGNOSIS — E559 Vitamin D deficiency, unspecified: Secondary | ICD-10-CM | POA: Insufficient documentation

## 2020-03-25 DIAGNOSIS — Z0000 Encounter for general adult medical examination without abnormal findings: Secondary | ICD-10-CM

## 2020-03-25 DIAGNOSIS — Z793 Long term (current) use of hormonal contraceptives: Secondary | ICD-10-CM | POA: Insufficient documentation

## 2020-03-25 DIAGNOSIS — Z1159 Encounter for screening for other viral diseases: Secondary | ICD-10-CM

## 2020-03-25 DIAGNOSIS — Z131 Encounter for screening for diabetes mellitus: Secondary | ICD-10-CM

## 2020-03-25 LAB — LIPID PANEL
Cholesterol: 184 mg/dL (ref 0–200)
HDL: 48.1 mg/dL (ref >39.00)
LDL Cholesterol: 117 mg/dL — ABNORMAL HIGH (ref 0–99)
NonHDL: 136.14
Total CHOL/HDL Ratio: 4
Triglycerides: 95 mg/dL (ref 0.0–149.0)
VLDL: 19 mg/dL (ref 0.0–40.0)

## 2020-03-25 LAB — COMPREHENSIVE METABOLIC PANEL
ALT: 20 U/L (ref 0–35)
AST: 42 U/L — ABNORMAL HIGH (ref 0–37)
Albumin: 4.4 g/dL (ref 3.5–5.2)
Alkaline Phosphatase: 43 U/L (ref 39–117)
BUN: 15 mg/dL (ref 6–23)
CO2: 28 mEq/L (ref 19–32)
Calcium: 9.5 mg/dL (ref 8.4–10.5)
Chloride: 102 mEq/L (ref 96–112)
Creatinine, Ser: 0.85 mg/dL (ref 0.40–1.20)
GFR: 84.76 mL/min (ref >60.00)
Glucose, Bld: 82 mg/dL (ref 70–99)
Potassium: 4.4 mEq/L (ref 3.5–5.1)
Sodium: 136 mEq/L (ref 135–145)
Total Bilirubin: 0.7 mg/dL (ref 0.2–1.2)
Total Protein: 6.9 g/dL (ref 6.0–8.3)

## 2020-03-25 LAB — CBC WITH DIFFERENTIAL/PLATELET
Basophils Absolute: 0 10*3/uL (ref 0.0–0.1)
Basophils Relative: 0.3 % (ref 0.0–3.0)
Eosinophils Absolute: 0.2 10*3/uL (ref 0.0–0.7)
Eosinophils Relative: 2.9 % (ref 0.0–5.0)
HCT: 43.6 % (ref 36.0–46.0)
Hemoglobin: 14.9 g/dL (ref 12.0–15.0)
Lymphocytes Relative: 29.3 % (ref 12.0–46.0)
Lymphs Abs: 2.1 10*3/uL (ref 0.7–4.0)
MCHC: 34.2 g/dL (ref 30.0–36.0)
MCV: 92.1 fl (ref 78.0–100.0)
Monocytes Absolute: 0.4 10*3/uL (ref 0.1–1.0)
Monocytes Relative: 5.1 % (ref 3.0–12.0)
Neutro Abs: 4.4 10*3/uL (ref 1.4–7.7)
Neutrophils Relative %: 62.4 % (ref 43.0–77.0)
Platelets: 294 10*3/uL (ref 150.0–400.0)
RBC: 4.74 Mil/uL (ref 3.87–5.11)
RDW: 13.7 % (ref 11.5–15.5)
WBC: 7.1 10*3/uL (ref 4.0–10.5)

## 2020-03-25 LAB — TSH: TSH: 2.74 u[IU]/mL (ref 0.35–4.50)

## 2020-03-25 LAB — VITAMIN D 25 HYDROXY (VIT D DEFICIENCY, FRACTURES): VITD: 68.73 ng/mL (ref 30.00–100.00)

## 2020-03-25 LAB — HEMOGLOBIN A1C: Hgb A1c MFr Bld: 5.5 % (ref 4.6–6.5)

## 2020-03-25 NOTE — Progress Notes (Signed)
This visit occurred during the SARS-CoV-2 public health emergency.  Safety protocols were in place, including screening questions prior to the visit, additional usage of staff PPE, and extensive cleaning of exam room while observing appropriate contact time as indicated for disinfecting solutions.    Patient ID: Dorothy Campbell, female  DOB: [birth year], 40 y.o.   MRN: [id number] Patient Care Team    Relationship Specialty Notifications Start End  Natalia Leatherwood, DO PCP - General Family Medicine  01/16/18   Genia Del, MD Consulting Physician Obstetrics and Gynecology  01/16/18     Chief Complaint  Patient presents with  . Annual Exam    Subjective:  Dorothy Campbell is a 40 y.o.  Female  present for CPE. All past medical history, surgical history, allergies, family history, immunizations, medications and social history were updated in the electronic medical record today. All recent labs, ED visits and hospitalizations within the last year were reviewed.  Health maintenance:  Colonoscopy: no fhx.  Complete age 46 Mammogram: No fhx. scheduled for 09/2019 through  gyn.  Cervical cancer screening: last pap: 08/05/2017,completed by: Seymour Bars Immunizations: tdap UTD 10/2014, Influenza UTD declined (encouraged yearly),Covid declined/counseled Infectious disease screening: HIV completed 2012, hep C agreeable to testing.  DEXA: N/A Assistive device: none Oxygen JKD:TOIZ Patient has a Dental home. Hospitalizations/ED visits: reviewed  Depression screen [facility] 2/9 03/25/2020 08/15/2018 01/16/2018  Decreased Interest 0 0 0  Down, Depressed, Hopeless 0 0 0  PHQ - 2 Score 0 0 0   No flowsheet data found.   Immunization History  Administered Date(s) Administered  . Influenza Whole 11/23/2011  . Influenza,inj,Quad PF,6+ Mos 01/16/2018  . Tdap 11/03/2014    Past Medical History:  Diagnosis Date  . Chicken pox   . Hyperlipidemia   . Kidney stone   . UTI (urinary tract infection)     No Known Allergies Past Surgical History:  Procedure Laterality Date  . AUGMENTATION MAMMAPLASTY  2013  . FOOT SURGERY Bilateral 2016/2017   bone spurs   Family History  Problem Relation Age of Onset  . Diabetes Mother   . COPD Mother   . Diabetes Maternal Grandmother   . Heart disease Maternal Grandfather        CHF  . Diabetes Maternal Grandfather   . Heart disease Paternal Grandfather        MI  . Hyperlipidemia Paternal Grandfather   . Hypertension Paternal Grandfather   . Hyperlipidemia Father   . Hypertension Father    Social History   Social History Narrative   Safety:      -Wears a bicycle helmet riding a bike: Yes     -smoke alarm in the home:Yes     - wears seatbelt: Yes     - Feels safe in their relationships: Yes      Work or School: B.S. Training and development officer,  works in Airline pilot for [employer] Situation: living with spouse and 2 children    Spiritual Beliefs: christian   Lifestyle: 2x per week goes to gym for cardio and weights, healthy diet          Allergies as of 03/25/2020   No Known Allergies     Medication List       Accurate as of March 25, 2020  8:24 AM. If you have any questions, ask your nurse or doctor.        cholecalciferol 25 MCG (1000 UNIT) tablet Commonly known as: VITAMIN D3 Take 1,000  Units by mouth daily.   doxylamine (Sleep) 25 MG tablet Commonly known as: UNISOM Take 25 mg by mouth at bedtime as needed.   Fish Oil 1000 MG Caps Take by mouth.   multivitamin tablet Take 1 tablet by mouth daily.   norethindrone-ethinyl estradiol 1-20 MG-MCG tablet Commonly known as: Junel 1/20 Take 1 tablet by mouth daily.       All past medical history, surgical history, allergies, family history, immunizations andmedications were updated in the EMR today and reviewed under the history and medication portions of their EMR.     No results found for this or any previous visit (from the past 2160 hour(s)).  US BREAST LTD UNI RIGHT  INC AXILLA Result Date: 10/10/2019  RECOMMENDATION: Annual screening mammography. I have discussed the findings and recommendations with the patient. If applicable, a reminder letter will be sent to the patient regarding the next appointment. BI-RADS CATEGORY  2: Benign. Electronically Signed   By: Gerome Sam III M.D   On: 10/10/2019 15:42     ROS: 14 pt review of systems performed and negative (unless mentioned in an HPI)  Objective: BP 108/72 (BP Location: Left Arm, Patient Position: Sitting, Cuff Size: Normal)   Pulse 68   Temp 97.9 F (36.6 C) (Oral)   Resp 16   Ht 5' 5.75" (1.67 m)   Wt 138 lb 9.6 oz (62.9 kg)   SpO2 100%   BMI 22.54 kg/m  Gen: Afebrile. No acute distress. Nontoxic in appearance, well-developed, well-nourished,  Pleasant female/.  HENT: AT. [employer]. Bilateral TM visualized and normal in appearance, normal external auditory canal. MMM, no oral lesions, adequate dentition. Bilateral nares within normal limits. Throat without erythema, ulcerations or exudates. no Cough on exam, no hoarseness on exam. Eyes:Pupils Equal Round Reactive to light, Extraocular movements intact,  Conjunctiva without redness, discharge or icterus. Neck/lymp/endocrine: Supple,no lymphadenopathy, no thyromegaly CV: RRR no murmurn, no edema, +2/4 P posterior tibialis pulses.  Chest: CTAB, no wheeze, rhonchi or crackles. normal Respiratory effort. good Air movement. Abd: Soft. flat. NTND. BS present. no Masses palpated. No hepatosplenomegaly. No rebound tenderness or guarding. Skin: no rashes, purpura or petechiae. Warm and well-perfused. Skin intact. Neuro/Msk:  Normal gait. PERLA. EOMi. Alert. Oriented x3.  Cranial nerves II through XII intact. Muscle strength 5/5 upper/lower extremity. DTRs equal bilaterally. Psych: Normal affect, dress and demeanor. Normal speech. Normal thought content and judgment.  No exam data present  Assessment/plan: ANABIA WEATHERWAX is a 40 y.o. female present for  CPE Long term (current) use of hormonal contraceptives - CBC with Differential/Platelet - Comprehensive metabolic panel - Lipid panel - TSH Need for hepatitis C screening test - Hepatitis C Antibody Diabetes mellitus screening - Hemoglobin A1c Vitamin D deficiency - VITAMIN D 25 Hydroxy (Vit-D Deficiency, Fractures Elevated LDL cholesterol level - lipid - TSH Encounter for preventive health examination Patient was encouraged to exercise greater than 150 minutes a week. Patient was encouraged to choose a diet filled with fresh fruits and vegetables, and lean meats. AVS provided to patient today for education/recommendation on gender specific health and safety maintenance. Colonoscopy: no fhx.  Complete age 66 Mammogram: No fhx. scheduled for 09/2019 through  gyn.  Cervical cancer screening: last pap: 08/05/2017,completed by: Seymour Bars Immunizations: tdap UTD 10/2014, Influenza UTD declined (encouraged yearly),Covid declined/counseled Infectious disease screening: HIV completed 2012, hep C agreeable to testing.  DEXA: N/A  Return in about 1 year (around 03/25/2021) for CPE (30 min).  Orders Placed This Encounter  Procedures  . CBC with Differential/Platelet  . Comprehensive metabolic panel  . Hemoglobin A1c  . Lipid panel  . TSH  . VITAMIN D 25 Hydroxy (Vit-D Deficiency, Fractures)  . Hepatitis C Antibody   No orders of the defined types were placed in this encounter.  Referral Orders  No referral(s) requested today     Electronically signed by: Felix Pacini, DO [facility] [place]

## 2020-03-25 NOTE — Patient Instructions (Signed)
Health Maintenance, Female Adopting a healthy lifestyle and getting preventive care are important in promoting health and wellness. Ask your health care provider about:  The right schedule for you to have regular tests and exams.  Things you can do on your own to prevent diseases and keep yourself healthy. What should I know about diet, weight, and exercise? Eat a healthy diet  Eat a diet that includes plenty of vegetables, fruits, low-fat dairy products, and lean protein.  Do not eat a lot of foods that are high in solid fats, added sugars, or sodium.   Maintain a healthy weight Body mass index (BMI) is used to identify weight problems. It estimates body fat based on height and weight. Your health care provider can help determine your BMI and help you achieve or maintain a healthy weight. Get regular exercise Get regular exercise. This is one of the most important things you can do for your health. Most adults should:  Exercise for at least 150 minutes each week. The exercise should increase your heart rate and make you sweat (moderate-intensity exercise).  Do strengthening exercises at least twice a week. This is in addition to the moderate-intensity exercise.  Spend less time sitting. Even light physical activity can be beneficial. Watch cholesterol and blood lipids Have your blood tested for lipids and cholesterol at 42 years of age, then have this test every 5 years. Have your cholesterol levels checked more often if:  Your lipid or cholesterol levels are high.  You are older than 42 years of age.  You are at high risk for heart disease. What should I know about cancer screening? Depending on your health history and family history, you may need to have cancer screening at various ages. This may include screening for:  Breast cancer.  Cervical cancer.  Colorectal cancer.  Skin cancer.  Lung cancer. What should I know about heart disease, diabetes, and high blood  pressure? Blood pressure and heart disease  High blood pressure causes heart disease and increases the risk of stroke. This is more likely to develop in people who have high blood pressure readings, are of African descent, or are overweight.  Have your blood pressure checked: ? Every 3-5 years if you are 18-39 years of age. ? Every year if you are 40 years old or older. Diabetes Have regular diabetes screenings. This checks your fasting blood sugar level. Have the screening done:  Once every three years after age 40 if you are at a normal weight and have a low risk for diabetes.  More often and at a younger age if you are overweight or have a high risk for diabetes. What should I know about preventing infection? Hepatitis B If you have a higher risk for hepatitis B, you should be screened for this virus. Talk with your health care provider to find out if you are at risk for hepatitis B infection. Hepatitis C Testing is recommended for:  Everyone born from 1945 through 1965.  Anyone with known risk factors for hepatitis C. Sexually transmitted infections (STIs)  Get screened for STIs, including gonorrhea and chlamydia, if: ? You are sexually active and are younger than 42 years of age. ? You are older than 42 years of age and your health care provider tells you that you are at risk for this type of infection. ? Your sexual activity has changed since you were last screened, and you are at increased risk for chlamydia or gonorrhea. Ask your health care provider   if you are at risk.  Ask your health care provider about whether you are at high risk for HIV. Your health care provider may recommend a prescription medicine to help prevent HIV infection. If you choose to take medicine to prevent HIV, you should first get tested for HIV. You should then be tested every 3 months for as long as you are taking the medicine. Pregnancy  If you are about to stop having your period (premenopausal) and  you may become pregnant, seek counseling before you get pregnant.  Take 400 to 800 micrograms (mcg) of folic acid every day if you become pregnant.  Ask for birth control (contraception) if you want to prevent pregnancy. Osteoporosis and menopause Osteoporosis is a disease in which the bones lose minerals and strength with aging. This can result in bone fractures. If you are 65 years old or older, or if you are at risk for osteoporosis and fractures, ask your health care provider if you should:  Be screened for bone loss.  Take a calcium or vitamin D supplement to lower your risk of fractures.  Be given hormone replacement therapy (HRT) to treat symptoms of menopause. Follow these instructions at home: Lifestyle  Do not use any products that contain nicotine or tobacco, such as cigarettes, e-cigarettes, and chewing tobacco. If you need help quitting, ask your health care provider.  Do not use street drugs.  Do not share needles.  Ask your health care provider for help if you need support or information about quitting drugs. Alcohol use  Do not drink alcohol if: ? Your health care provider tells you not to drink. ? You are pregnant, may be pregnant, or are planning to become pregnant.  If you drink alcohol: ? Limit how much you use to 0-1 drink a day. ? Limit intake if you are breastfeeding.  Be aware of how much alcohol is in your drink. In the [place] one drink equals one 12 oz bottle of beer (355 mL), one 5 oz glass of wine (148 mL), or one 1 oz glass of hard liquor (44 mL). General instructions  Schedule regular health, dental, and eye exams.  Stay current with your vaccines.  Tell your health care provider if: ? You often feel depressed. ? You have ever been abused or do not feel safe at home. Summary  Adopting a healthy lifestyle and getting preventive care are important in promoting health and wellness.  Follow your health care provider's instructions about healthy  diet, exercising, and getting tested or screened for diseases.  Follow your health care provider's instructions on monitoring your cholesterol and blood pressure. This information is not intended to replace advice given to you by your health care provider. Make sure you discuss any questions you have with your health care provider. Document Revised: 02/01/2018 Document Reviewed: 02/01/2018 Elsevier Patient Education  2021 Elsevier Inc.  

## 2020-03-26 LAB — HEPATITIS C ANTIBODY
Hepatitis C Ab: NONREACTIVE
SIGNAL TO CUT-OFF: 0.01 (ref <1.00)

## 2020-08-01 ENCOUNTER — Other Ambulatory Visit: Payer: Self-pay | Admitting: Obstetrics & Gynecology

## 2020-08-01 NOTE — Telephone Encounter (Signed)
Annual exam scheduled on 08/12/20 

## 2020-08-12 ENCOUNTER — Encounter: Payer: Self-pay | Admitting: Obstetrics & Gynecology

## 2020-08-12 ENCOUNTER — Other Ambulatory Visit (HOSPITAL_COMMUNITY)
Admission: RE | Admit: 2020-08-12 | Discharge: 2020-08-12 | Disposition: A | Discharge status: Home / Self Care | Payer: Commercial Managed Care - PPO | Source: Ambulatory Visit | Attending: Obstetrics & Gynecology | Admitting: Obstetrics & Gynecology

## 2020-08-12 ENCOUNTER — Other Ambulatory Visit: Payer: Self-pay

## 2020-08-12 ENCOUNTER — Ambulatory Visit (INDEPENDENT_AMBULATORY_CARE_PROVIDER_SITE_OTHER): Payer: Commercial Managed Care - PPO | Admitting: Obstetrics & Gynecology

## 2020-08-12 VITALS — BP 110/70 | Ht 66.0 in | Wt 146.0 lb

## 2020-08-12 DIAGNOSIS — Z3040 Encounter for surveillance of contraceptives, unspecified: Secondary | ICD-10-CM

## 2020-08-12 DIAGNOSIS — Z01419 Encounter for gynecological examination (general) (routine) without abnormal findings: Secondary | ICD-10-CM | POA: Diagnosis present

## 2020-08-12 NOTE — Progress Notes (Signed)
Dorothy Campbell Nov 24, 1980 [id number]   History:    40 y.o. G2P2L2 Married.  Son is 64 yo, daughter is 43 yo.  Works in Airline pilot for [employer].   RP:  Established patient presenting for annual gyn exam   HPI: Well on Junel 1/20, but would like to stop and do natural method.  No breakthrough bleeding.  No pelvic pain.  No pain with intercourse.  Urine and bowel movements normal.  Breasts normal with bilateral implants.  Body mass index good at 23.57.  Very fit and eating well.  Health labs with family physician.     Past medical history,surgical history, family history and social history were all reviewed and documented in the EPIC chart.  Gynecologic History Patient's last menstrual period was 08/05/2020 (exact date).  Obstetric History OB History  Gravida Para Term Preterm AB Living  2 2       2   SAB IAB Ectopic Multiple Live Births               # Outcome Date GA Lbr Len/2nd Weight Sex Delivery Anes PTL Lv  2 Para           1 Para              ROS: A ROS was performed and pertinent positives and negatives are included in the history.  GENERAL: No fevers or chills. HEENT: No change in vision, no earache, sore throat or sinus congestion. NECK: No pain or stiffness. CARDIOVASCULAR: No chest pain or pressure. No palpitations. PULMONARY: No shortness of breath, cough or wheeze. GASTROINTESTINAL: No abdominal pain, nausea, vomiting or diarrhea, melena or bright red blood per rectum. GENITOURINARY: No urinary frequency, urgency, hesitancy or dysuria. MUSCULOSKELETAL: No joint or muscle pain, no back pain, no recent trauma. DERMATOLOGIC: No rash, no itching, no lesions. ENDOCRINE: No polyuria, polydipsia, no heat or cold intolerance. No recent change in weight. HEMATOLOGICAL: No anemia or easy bruising or bleeding. NEUROLOGIC: No headache, seizures, numbness, tingling or weakness. PSYCHIATRIC: No depression, no loss of interest in normal activity or change in sleep pattern.      Exam:   BP 110/70   Ht 5\' 6"  (1.676 m)   Wt 146 lb (66.2 kg)   LMP 08/05/2020 (Exact Date) Comment: JUNEL  BMI 23.57 kg/m   Body mass index is 23.57 kg/m.  General appearance : Well developed well nourished female. No acute distress HEENT: Eyes: no retinal hemorrhage or exudates,  Neck supple, trachea midline, no carotid bruits, no thyroidmegaly Lungs: Clear to auscultation, no rhonchi or wheezes, or rib retractions  Heart: Regular rate and rhythm, no murmurs or gallops Breast:Examined in sitting and supine position were symmetrical in appearance, no palpable masses or tenderness,  no skin retraction, no nipple inversion, no nipple discharge, no skin discoloration, no axillary or supraclavicular lymphadenopathy Abdomen: no palpable masses or tenderness, no rebound or guarding Extremities: no edema or skin discoloration or tenderness  Pelvic: Vulva: Normal             Vagina: No gross lesions or discharge  Cervix: No gross lesions or discharge.  Pap reflex done.  Uterus  AV, normal size, shape and consistency, non-tender and mobile  Adnexa  Without masses or tenderness  Anus: Normal   Assessment/Plan:  40 y.o. female for annual exam   1. Encounter for routine gynecological examination with Papanicolaou smear of cervix Normal gynecologic exam.  Pap reflex done.  Breast exam status post bilateral implant normal.  We will schedule a screening mammogram August 2022.  Good body mass index at 23.57.  Continue with fitness and healthy nutrition.  Health labs with family physician. - Cytology - PAP( [facility])  2. Encounter for surveillance of contraceptives, unspecified contraceptive Decision to stop birth control pills and use natural method for contraception.  Patient is reading and educating herself to be safe with natural method.  Recommended to use condoms to observe her cycle for the first 3 months and patient was already planning to do that.  Other orders - Zinc 100 MG  TABS; Take by mouth.   Genia Del MD, 8:24 AM 08/12/2020

## 2020-08-14 ENCOUNTER — Other Ambulatory Visit: Payer: Self-pay | Admitting: Obstetrics & Gynecology

## 2020-08-14 DIAGNOSIS — Z1231 Encounter for screening mammogram for malignant neoplasm of breast: Secondary | ICD-10-CM

## 2020-08-14 LAB — CYTOLOGY - PAP
Comment: NEGATIVE
Diagnosis: NEGATIVE
High risk HPV: NEGATIVE

## 2020-10-04 ENCOUNTER — Other Ambulatory Visit: Payer: Self-pay | Admitting: Obstetrics & Gynecology

## 2020-10-06 NOTE — Telephone Encounter (Signed)
Med refill request: Junel 1/20 Last AEX: 08/12/20 Next AEX: 08/13/21 Last MMG : Screening 09/24/19, Right breast Dx on 10/10/19, IMPRESSION: Fibrocystic changes.  No evidence of malignancy.  RECOMMENDATION: Annual screening mammography. Next MMG scheduled 10/21/20   Please advise on refill.

## 2020-10-21 ENCOUNTER — Other Ambulatory Visit: Payer: Self-pay

## 2020-10-21 ENCOUNTER — Ambulatory Visit
Admission: RE | Admit: 2020-10-21 | Discharge: 2020-10-21 | Disposition: A | Discharge status: Home / Self Care | Payer: Commercial Managed Care - PPO | Source: Ambulatory Visit | Attending: Obstetrics & Gynecology | Admitting: Obstetrics & Gynecology

## 2020-10-21 DIAGNOSIS — Z1231 Encounter for screening mammogram for malignant neoplasm of breast: Secondary | ICD-10-CM

## 2020-12-14 ENCOUNTER — Other Ambulatory Visit: Payer: Self-pay | Admitting: Obstetrics & Gynecology

## 2021-03-26 ENCOUNTER — Other Ambulatory Visit: Payer: Self-pay

## 2021-03-26 ENCOUNTER — Ambulatory Visit (INDEPENDENT_AMBULATORY_CARE_PROVIDER_SITE_OTHER): Payer: Commercial Managed Care - PPO | Admitting: Family Medicine

## 2021-03-26 ENCOUNTER — Encounter: Payer: Self-pay | Admitting: Family Medicine

## 2021-03-26 VITALS — BP 107/66 | HR 61 | Temp 98.3°F | Ht 66.0 in | Wt 139.0 lb

## 2021-03-26 DIAGNOSIS — E7800 Pure hypercholesterolemia, unspecified: Secondary | ICD-10-CM | POA: Diagnosis not present

## 2021-03-26 DIAGNOSIS — Z131 Encounter for screening for diabetes mellitus: Secondary | ICD-10-CM

## 2021-03-26 DIAGNOSIS — E559 Vitamin D deficiency, unspecified: Secondary | ICD-10-CM | POA: Diagnosis not present

## 2021-03-26 DIAGNOSIS — Z0000 Encounter for general adult medical examination without abnormal findings: Secondary | ICD-10-CM

## 2021-03-26 LAB — COMPREHENSIVE METABOLIC PANEL
ALT: 16 U/L (ref 0–35)
AST: 18 U/L (ref 0–37)
Albumin: 4.4 g/dL (ref 3.5–5.2)
Alkaline Phosphatase: 43 U/L (ref 39–117)
BUN: 19 mg/dL (ref 6–23)
CO2: 29 mEq/L (ref 19–32)
Calcium: 9.3 mg/dL (ref 8.4–10.5)
Chloride: 104 mEq/L (ref 96–112)
Creatinine, Ser: 0.86 mg/dL (ref 0.40–1.20)
GFR: 82.99 mL/min (ref >60.00)
Glucose, Bld: 92 mg/dL (ref 70–99)
Potassium: 4.4 mEq/L (ref 3.5–5.1)
Sodium: 139 mEq/L (ref 135–145)
Total Bilirubin: 0.4 mg/dL (ref 0.2–1.2)
Total Protein: 6.7 g/dL (ref 6.0–8.3)

## 2021-03-26 LAB — LIPID PANEL
Cholesterol: 222 mg/dL — ABNORMAL HIGH (ref 0–200)
HDL: 64.5 mg/dL (ref >39.00)
LDL Cholesterol: 146 mg/dL — ABNORMAL HIGH (ref 0–99)
NonHDL: 157.47
Total CHOL/HDL Ratio: 3
Triglycerides: 56 mg/dL (ref 0.0–149.0)
VLDL: 11.2 mg/dL (ref 0.0–40.0)

## 2021-03-26 LAB — TSH: TSH: 2.46 u[IU]/mL (ref 0.35–5.50)

## 2021-03-26 LAB — CBC
HCT: 41.2 % (ref 36.0–46.0)
Hemoglobin: 13.8 g/dL (ref 12.0–15.0)
MCHC: 33.4 g/dL (ref 30.0–36.0)
MCV: 91.9 fl (ref 78.0–100.0)
Platelets: 247 10*3/uL (ref 150.0–400.0)
RBC: 4.48 Mil/uL (ref 3.87–5.11)
RDW: 14 % (ref 11.5–15.5)
WBC: 5.9 10*3/uL (ref 4.0–10.5)

## 2021-03-26 LAB — VITAMIN D 25 HYDROXY (VIT D DEFICIENCY, FRACTURES): VITD: 57.96 ng/mL (ref 30.00–100.00)

## 2021-03-26 LAB — HEMOGLOBIN A1C: Hgb A1c MFr Bld: 5.3 % (ref 4.6–6.5)

## 2021-03-26 NOTE — Progress Notes (Signed)
This visit occurred during the SARS-CoV-2 public health emergency.  Safety protocols were in place, including screening questions prior to the visit, additional usage of staff PPE, and extensive cleaning of exam room while observing appropriate contact time as indicated for disinfecting solutions.    Patient ID: Dorothy Sectionmanda T Rogerson, female  DOB: 04/04/1980, 41 y.o.   MRN: [id number] Patient Care Team    Relationship Specialty Notifications Start End  Natalia LeatherwoodKuneff, Shad Ledvina A, DO PCP - General Family Medicine  01/16/18   Genia DelLavoie, Marie-Lyne, MD Consulting Physician Obstetrics and Gynecology  01/16/18     Chief Complaint  Patient presents with   Annual Exam    Pt is fasting    Subjective: Dorothy Campbell is a 41 y.o.  Female  present for CPE. All past medical history, surgical history, allergies, family history, immunizations, medications and social history were updated in the electronic medical record today. All recent labs, ED visits and hospitalizations within the last year were reviewed.  Health maintenance:  Colonoscopy: no fhx.  Complete age 41 Mammogram: No fhx. scheduled for 09/2020 through  gyn.  Cervical cancer screening: last pap: 07/2020,completed by: Seymour BarsLavoie Immunizations: tdap UTD 10/2014, Influenza UTD declined (encouraged yearly),Covid declined/counseled. Infectious disease screening: HIV completed 2012, hep C completed DEXA: routine screen at 60-65 Assistive device: none Oxygen WUJ:WJXBuse:none Patient has a Dental home. Hospitalizations/ED visits: reviewed  Depression screen [facility] 2/9 03/26/2021 03/25/2020 08/15/2018 01/16/2018  Decreased Interest 0 0 0 0  Down, Depressed, Hopeless 0 0 0 0  PHQ - 2 Score 0 0 0 0   No flowsheet data found.   Immunization History  Administered Date(s) Administered   Influenza Whole 11/23/2011   Influenza,inj,Quad PF,6+ Mos 01/16/2018   Tdap 11/03/2014   Past Medical History:  Diagnosis Date   Chicken pox    Hyperlipidemia    Kidney stone    UTI  (urinary tract infection)    No Known Allergies Past Surgical History:  Procedure Laterality Date   AUGMENTATION MAMMAPLASTY Bilateral 2013   FOOT SURGERY Bilateral 2016/2017   bone spurs   Family History  Problem Relation Age of Onset   Diabetes Mother    COPD Mother    Diabetes Maternal Grandmother    Heart disease Maternal Grandfather        CHF   Diabetes Maternal Grandfather    Heart disease Paternal Grandfather        MI   Hyperlipidemia Paternal Grandfather    Hypertension Paternal Grandfather    Hyperlipidemia Father    Hypertension Father    Social History   Social History Narrative   Safety:      -Wears a bicycle helmet riding a bike: Yes     -smoke alarm in the home:Yes     - wears seatbelt: Yes     - Feels safe in their relationships: Yes      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 If you have any questions, ask your nurse or doctor.          STOP  taking these medications    Junel 1/20 1-20 MG-MCG tablet Generic drug: norethindrone-ethinyl estradiol Stopped by: Felix Pacini, DO       TAKE these medications    cholecalciferol 25 MCG (1000 UNIT) tablet Commonly known as: VITAMIN D3 Take 1,000 Units by mouth daily.   doxylamine (Sleep) 25 MG tablet Commonly known as: UNISOM Take 25 mg by mouth at bedtime as needed.   Fish Oil 1000 MG Caps Take by mouth.   multivitamin tablet Take 1 tablet by mouth daily.   Zinc 100 MG Tabs Take by mouth.        All past medical history, surgical history, allergies, family history, immunizations andmedications were updated in the EMR today and reviewed under the history and medication  portions of their EMR.     No results found for this or any previous visit (from the past 2160 hour(s)).  MM 3D SCREEN BREAST W/IMPLANT BILATERAL Result Date: 10/26/2020 RECOMMENDATION: Screening mammogram in one year. (Code:[id number]-B-01Y) BI-RADS CATEGORY  1:  Negative. Electronically Signed   By: Sherian Rein M.D.   On: 10/26/2020 11:37   ROS 14 pt review of systems performed and negative (unless mentioned in an HPI)  Objective: BP 107/66    Pulse 61    Temp 98.3 F (36.8 C) (Oral)    Ht 5\' 6"  (1.676 m)    Wt 139 lb (63 kg)    LMP 03/12/2021    SpO2 100%    BMI 22.44 kg/m  Physical Exam Vitals and nursing note reviewed.  Constitutional:      General: She is not in acute distress.    Appearance: Normal appearance. She is not ill-appearing or toxic-appearing.  HENT:     Head: Normocephalic and atraumatic.     Right Ear: Tympanic membrane, ear canal and external ear normal. There is no impacted cerumen.     Left Ear: Tympanic membrane, ear canal and external ear normal. There is no impacted cerumen.     Nose: No congestion or rhinorrhea.     Mouth/Throat:     Mouth: Mucous membranes are moist.     Pharynx: Oropharynx is clear. No oropharyngeal exudate or posterior oropharyngeal erythema.  Eyes:     General: No scleral icterus.       Right eye: No discharge.        Left eye: No discharge.     Extraocular Movements: Extraocular movements intact.     Conjunctiva/sclera: Conjunctivae normal.     Pupils: Pupils are equal, round, and reactive to light.  Cardiovascular:     Rate and Rhythm: Normal rate and regular rhythm.     Pulses: Normal pulses.     Heart sounds: Normal heart sounds. No murmur heard.   No friction rub. No gallop.  Pulmonary:     Effort: Pulmonary effort is normal. No respiratory distress.     Breath sounds: Normal breath sounds. No stridor. No wheezing, rhonchi or rales.  Chest:     Chest wall: No tenderness.  Abdominal:     General: Abdomen is flat. Bowel sounds  are normal. There is no distension.     Palpations: Abdomen is soft. There is no mass.     Tenderness: There is no abdominal tenderness. There is no right CVA tenderness, left CVA tenderness, guarding or rebound.     Hernia: No hernia is present.  Musculoskeletal:        General: No swelling, tenderness or deformity. Normal range of motion.  Cervical back: Normal range of motion and neck supple. No rigidity or tenderness.     Right lower leg: No edema.     Left lower leg: No edema.  Lymphadenopathy:     Cervical: No cervical adenopathy.  Skin:    General: Skin is warm and dry.     Coloration: Skin is not jaundiced or pale.     Findings: No bruising, erythema, lesion or rash.  Neurological:     General: No focal deficit present.     Mental Status: She is alert and oriented to person, place, and time. Mental status is at baseline.     Cranial Nerves: No cranial nerve deficit.     Sensory: No sensory deficit.     Motor: No weakness.     Coordination: Coordination normal.     Gait: Gait normal.     Deep Tendon Reflexes: Reflexes normal.  Psychiatric:        Mood and Affect: Mood normal.        Behavior: Behavior normal.        Thought Content: Thought content normal.        Judgment: Judgment normal.     No results found.  Assessment/plan: MEREDITH KILBRIDE is a 41 y.o. female present for CPE  Elevated LDL cholesterol level Diet and exercise. - CBC - Comprehensive metabolic panel - Lipid panel - TSH Vitamin D deficiency - VITAMIN D 25 Hydroxy (Vit-D Deficiency, Fractures) Diabetes mellitus screening - Hemoglobin A1c Routine general medical examination at a health care facility Colonoscopy: no fhx.  Complete age 71 Mammogram: No fhx. scheduled for 09/2020 through  gyn.  Cervical cancer screening: last pap: 07/2020,completed by: Seymour Bars Immunizations: tdap UTD 10/2014, Influenza UTD declined (encouraged yearly),Covid declined/counseled. Infectious disease screening: HIV  completed 2012, hep C completed DEXA: routine screen at 60-65 Patient was encouraged to exercise greater than 150 minutes a week. Patient was encouraged to choose a diet filled with fresh fruits and vegetables, and lean meats. AVS provided to patient today for education/recommendation on gender specific health and safety maintenance.  Return in about 1 year (around 03/27/2022) for CPE (30 min).   Orders Placed This Encounter  Procedures   CBC   Comprehensive metabolic panel   Hemoglobin A1c   Lipid panel   TSH   VITAMIN D 25 Hydroxy (Vit-D Deficiency, Fractures)   No orders of the defined types were placed in this encounter.  Referral Orders  No referral(s) requested today     Electronically signed by: Felix Pacini, DO [facility] [place]

## 2021-03-26 NOTE — Patient Instructions (Signed)
°Great to see you today.  °I have refilled the medication(s) we provide.  ° °If labs were collected, we will inform you of lab results once received either by echart message or telephone call.  ° - echart message- for normal results that have been seen by the patient already.  ° - telephone call: abnormal results or if patient has not viewed results in their echart. ° °Health Maintenance, Female °Adopting a healthy lifestyle and getting preventive care are important in promoting health and wellness. Ask your health care provider about: °The right schedule for you to have regular tests and exams. °Things you can do on your own to prevent diseases and keep yourself healthy. °What should I know about diet, weight, and exercise? °Eat a healthy diet ° °Eat a diet that includes plenty of vegetables, fruits, low-fat dairy products, and lean protein. °Do not eat a lot of foods that are high in solid fats, added sugars, or sodium. °Maintain a healthy weight °Body mass index (BMI) is used to identify weight problems. It estimates body fat based on height and weight. Your health care provider can help determine your BMI and help you achieve or maintain a healthy weight. °Get regular exercise °Get regular exercise. This is one of the most important things you can do for your health. Most adults should: °Exercise for at least 150 minutes each week. The exercise should increase your heart rate and make you sweat (moderate-intensity exercise). °Do strengthening exercises at least twice a week. This is in addition to the moderate-intensity exercise. °Spend less time sitting. Even light physical activity can be beneficial. °Watch cholesterol and blood lipids °Have your blood tested for lipids and cholesterol at 43 years of age, then have this test every 5 years. °Have your cholesterol levels checked more often if: °Your lipid or cholesterol levels are high. °You are older than 43 years of age. °You are at high risk for heart  disease. °What should I know about cancer screening? °Depending on your health history and family history, you may need to have cancer screening at various ages. This may include screening for: °Breast cancer. °Cervical cancer. °Colorectal cancer. °Skin cancer. °Lung cancer. °What should I know about heart disease, diabetes, and high blood pressure? °Blood pressure and heart disease °High blood pressure causes heart disease and increases the risk of stroke. This is more likely to develop in people who have high blood pressure readings or are overweight. °Have your blood pressure checked: °Every 3-5 years if you are 18-39 years of age. °Every year if you are 40 years old or older. °Diabetes °Have regular diabetes screenings. This checks your fasting blood sugar level. Have the screening done: °Once every three years after age 40 if you are at a normal weight and have a low risk for diabetes. °More often and at a younger age if you are overweight or have a high risk for diabetes. °What should I know about preventing infection? °Hepatitis B °If you have a higher risk for hepatitis B, you should be screened for this virus. Talk with your health care provider to find out if you are at risk for hepatitis B infection. °Hepatitis C °Testing is recommended for: °Everyone born from 1945 through 1965. °Anyone with known risk factors for hepatitis C. °Sexually transmitted infections (STIs) °Get screened for STIs, including gonorrhea and chlamydia, if: °You are sexually active and are younger than 43 years of age. °You are older than 43 years of age and your health care provider   tells you that you are at risk for this type of infection. °Your sexual activity has changed since you were last screened, and you are at increased risk for chlamydia or gonorrhea. Ask your health care provider if you are at risk. °Ask your health care provider about whether you are at high risk for HIV. Your health care provider may recommend a  prescription medicine to help prevent HIV infection. If you choose to take medicine to prevent HIV, you should first get tested for HIV. You should then be tested every 3 months for as long as you are taking the medicine. °Pregnancy °If you are about to stop having your period (premenopausal) and you may become pregnant, seek counseling before you get pregnant. °Take 400 to 800 micrograms (mcg) of folic acid every day if you become pregnant. °Ask for birth control (contraception) if you want to prevent pregnancy. °Osteoporosis and menopause °Osteoporosis is a disease in which the bones lose minerals and strength with aging. This can result in bone fractures. If you are 65 years old or older, or if you are at risk for osteoporosis and fractures, ask your health care provider if you should: °Be screened for bone loss. °Take a calcium or vitamin D supplement to lower your risk of fractures. °Be given hormone replacement therapy (HRT) to treat symptoms of menopause. °Follow these instructions at home: °Alcohol use °Do not drink alcohol if: °Your health care provider tells you not to drink. °You are pregnant, may be pregnant, or are planning to become pregnant. °If you drink alcohol: °Limit how much you have to: °0-1 drink a day. °Know how much alcohol is in your drink. In the [place] one drink equals one 12 oz bottle of beer (355 mL), one 5 oz glass of wine (148 mL), or one 1½ oz glass of hard liquor (44 mL). °Lifestyle °Do not use any products that contain nicotine or tobacco. These products include cigarettes, chewing tobacco, and vaping devices, such as e-cigarettes. If you need help quitting, ask your health care provider. °Do not use street drugs. °Do not share needles. °Ask your health care provider for help if you need support or information about quitting drugs. °General instructions °Schedule regular health, dental, and eye exams. °Stay current with your vaccines. °Tell your health care provider if: °You often  feel depressed. °You have ever been abused or do not feel safe at home. °Summary °Adopting a healthy lifestyle and getting preventive care are important in promoting health and wellness. °Follow your health care provider's instructions about healthy diet, exercising, and getting tested or screened for diseases. °Follow your health care provider's instructions on monitoring your cholesterol and blood pressure. °This information is not intended to replace advice given to you by your health care provider. Make sure you discuss any questions you have with your health care provider. °Document Revised: 06/30/2020 Document Reviewed: 06/30/2020 °Elsevier Patient Education © 2022 [employer] ° °

## 2021-08-13 ENCOUNTER — Ambulatory Visit: Payer: Commercial Managed Care - PPO | Admitting: Obstetrics & Gynecology

## 2021-09-17 ENCOUNTER — Encounter: Payer: Self-pay | Admitting: Obstetrics & Gynecology

## 2021-09-17 ENCOUNTER — Ambulatory Visit (INDEPENDENT_AMBULATORY_CARE_PROVIDER_SITE_OTHER): Payer: Commercial Managed Care - PPO | Admitting: Obstetrics & Gynecology

## 2021-09-17 VITALS — BP 102/76 | HR 60 | Ht 65.5 in | Wt 142.0 lb

## 2021-09-17 DIAGNOSIS — R5383 Other fatigue: Secondary | ICD-10-CM

## 2021-09-17 DIAGNOSIS — Z3002 Counseling and instruction in natural family planning to avoid pregnancy: Secondary | ICD-10-CM | POA: Diagnosis not present

## 2021-09-17 DIAGNOSIS — Z01419 Encounter for gynecological examination (general) (routine) without abnormal findings: Secondary | ICD-10-CM | POA: Diagnosis not present

## 2021-09-17 NOTE — Progress Notes (Signed)
Dorothy Campbell 04/07/1980 [id number]   History:    41 y.o. G2P2L2 Married.  Son is 48 yo, daughter is 79 yo.  Works in Airline pilot for [employer].   RP:  Established patient presenting for annual gyn exam   HPI: Stopped Junel 1/20 last year and doing natural method of contraception, using condoms during fertile period.  Menses regular normal monthly.  No breakthrough bleeding.  No pelvic pain.  No pain with intercourse.  Pap Neg 07/2020.  No h/o abnormal Pap.  Will repeat Pap at 3 years. Urine and bowel movements normal.  Breasts normal with bilateral implants. Mammo Neg 09/2020.  Body mass index good at 23.27.  Very fit and eating well.  Health labs with family physician.   Past medical history,surgical history, family history and social history were all reviewed and documented in the EPIC chart.  Gynecologic History Patient's last menstrual period was 09/17/2021 (exact date). Obstetric History OB History  Gravida Para Term Preterm AB Living  2 2 2     2   SAB IAB Ectopic Multiple Live Births               # Outcome Date GA Lbr Len/2nd Weight Sex Delivery Anes PTL Lv  2 Term           1 Term              ROS: A ROS was performed and pertinent positives and negatives are included in the history.  GENERAL: No fevers or chills. HEENT: No change in vision, no earache, sore throat or sinus congestion. NECK: No pain or stiffness. CARDIOVASCULAR: No chest pain or pressure. No palpitations. PULMONARY: No shortness of breath, cough or wheeze. GASTROINTESTINAL: No abdominal pain, nausea, vomiting or diarrhea, melena or bright red blood per rectum. GENITOURINARY: No urinary frequency, urgency, hesitancy or dysuria. MUSCULOSKELETAL: No joint or muscle pain, no back pain, no recent trauma. DERMATOLOGIC: No rash, no itching, no lesions. ENDOCRINE: No polyuria, polydipsia, no heat or cold intolerance. No recent change in weight. HEMATOLOGICAL: No anemia or easy bruising or bleeding.  NEUROLOGIC: No headache, seizures, numbness, tingling or weakness. PSYCHIATRIC: No depression, no loss of interest in normal activity or change in sleep pattern.     Exam:   BP 102/76   Pulse 60   Ht 5' 5.5" (1.664 m)   Wt 142 lb (64.4 kg)   LMP 09/17/2021 (Exact Date)   SpO2 99%   BMI 23.27 kg/m   Body mass index is 23.27 kg/m.  General appearance : Well developed well nourished female. No acute distress HEENT: Eyes: no retinal hemorrhage or exudates,  Neck supple, trachea midline, no carotid bruits, no thyroidmegaly Lungs: Clear to auscultation, no rhonchi or wheezes, or rib retractions  Heart: Regular rate and rhythm, no murmurs or gallops Breast:Examined in sitting and supine position were symmetrical in appearance, no palpable masses or tenderness,  no skin retraction, no nipple inversion, no nipple discharge, no skin discoloration, no axillary or supraclavicular lymphadenopathy Abdomen: no palpable masses or tenderness, no rebound or guarding Extremities: no edema or skin discoloration or tenderness  Pelvic: Vulva: Normal             Vagina: No gross lesions or discharge  Cervix: No gross lesions or discharge  Uterus  RV, normal size, shape and consistency, non-tender and mobile  Adnexa  Without masses or tenderness  Anus: Normal   Assessment/Plan:  41 y.o. female for annual exam   1.  Well female exam with routine gynecological exam Stopped Junel 1/20 last year and doing natural method of contraception, using condoms during fertile period.  Menses regular normal monthly.  No breakthrough bleeding.  No pelvic pain.  No pain with intercourse.  Pap Neg 07/2020.  No h/o abnormal Pap.  Will repeat Pap at 3 years. Urine and bowel movements normal.  Breasts normal with bilateral implants. Mammo Neg 09/2020.  Body mass index good at 23.27.  Very fit and eating well.  Health labs with family physician.  2. Fatigue, unspecified type Feeling tired and cold very often, not cyclic.  No  constipation or weight gain.  Will do TSH to r/o Hypothyroidism. - TSH  3. Encounter for counseling and instruction in natural family planning to avoid pregnancy Uses condoms during the fertile period.  Other orders - B Complex Vitamins (B COMPLEX PO); Take by mouth.   Genia Del MD, 11:20 AM 09/17/2021

## 2021-09-18 LAB — TSH: TSH: 3.3 mIU/L

## 2022-03-29 ENCOUNTER — Ambulatory Visit (INDEPENDENT_AMBULATORY_CARE_PROVIDER_SITE_OTHER): Payer: Commercial Managed Care - PPO | Admitting: Family Medicine

## 2022-03-29 ENCOUNTER — Encounter: Payer: Self-pay | Admitting: Family Medicine

## 2022-03-29 VITALS — BP 101/67 | HR 69 | Temp 98.3°F | Ht 65.95 in | Wt 149.2 lb

## 2022-03-29 DIAGNOSIS — Z0000 Encounter for general adult medical examination without abnormal findings: Secondary | ICD-10-CM

## 2022-03-29 DIAGNOSIS — E559 Vitamin D deficiency, unspecified: Secondary | ICD-10-CM | POA: Diagnosis not present

## 2022-03-29 DIAGNOSIS — E7800 Pure hypercholesterolemia, unspecified: Secondary | ICD-10-CM

## 2022-03-29 LAB — COMPREHENSIVE METABOLIC PANEL
ALT: 13 U/L (ref 0–35)
AST: 17 U/L (ref 0–37)
Albumin: 4.3 g/dL (ref 3.5–5.2)
Alkaline Phosphatase: 42 U/L (ref 39–117)
BUN: 15 mg/dL (ref 6–23)
CO2: 26 mEq/L (ref 19–32)
Calcium: 9.1 mg/dL (ref 8.4–10.5)
Chloride: 104 mEq/L (ref 96–112)
Creatinine, Ser: 0.87 mg/dL (ref 0.40–1.20)
GFR: 81.27 mL/min (ref >60.00)
Glucose, Bld: 90 mg/dL (ref 70–99)
Potassium: 4.7 mEq/L (ref 3.5–5.1)
Sodium: 138 mEq/L (ref 135–145)
Total Bilirubin: 0.5 mg/dL (ref 0.2–1.2)
Total Protein: 6.5 g/dL (ref 6.0–8.3)

## 2022-03-29 LAB — CBC WITH DIFFERENTIAL/PLATELET
Basophils Absolute: 0 10*3/uL (ref 0.0–0.1)
Basophils Relative: 0.7 % (ref 0.0–3.0)
Eosinophils Absolute: 0.2 10*3/uL (ref 0.0–0.7)
Eosinophils Relative: 4.8 % (ref 0.0–5.0)
HCT: 39.3 % (ref 36.0–46.0)
Hemoglobin: 13.3 g/dL (ref 12.0–15.0)
Lymphocytes Relative: 44.9 % (ref 12.0–46.0)
Lymphs Abs: 2 10*3/uL (ref 0.7–4.0)
MCHC: 34 g/dL (ref 30.0–36.0)
MCV: 92.3 fl (ref 78.0–100.0)
Monocytes Absolute: 0.3 10*3/uL (ref 0.1–1.0)
Monocytes Relative: 7.2 % (ref 3.0–12.0)
Neutro Abs: 1.9 10*3/uL (ref 1.4–7.7)
Neutrophils Relative %: 42.4 % — ABNORMAL LOW (ref 43.0–77.0)
Platelets: 278 10*3/uL (ref 150.0–400.0)
RBC: 4.26 Mil/uL (ref 3.87–5.11)
RDW: 13.6 % (ref 11.5–15.5)
WBC: 4.5 10*3/uL (ref 4.0–10.5)

## 2022-03-29 LAB — LIPID PANEL
Cholesterol: 180 mg/dL (ref 0–200)
HDL: 60.7 mg/dL (ref >39.00)
LDL Cholesterol: 110 mg/dL — ABNORMAL HIGH (ref 0–99)
NonHDL: 119.52
Total CHOL/HDL Ratio: 3
Triglycerides: 50 mg/dL (ref 0.0–149.0)
VLDL: 10 mg/dL (ref 0.0–40.0)

## 2022-03-29 LAB — HEMOGLOBIN A1C: Hgb A1c MFr Bld: 5.2 % (ref 4.6–6.5)

## 2022-03-29 LAB — TSH: TSH: 3.3 u[IU]/mL (ref 0.35–5.50)

## 2022-03-29 NOTE — Progress Notes (Signed)
Patient ID: Dorothy Campbell, female  DOB: 08-07-1980, 42 y.o.   MRN: [id number] Patient Care Team    Relationship Specialty Notifications Start End  Ma Hillock, DO PCP - General Family Medicine  01/16/18   Princess Bruins, MD Consulting Physician Obstetrics and Gynecology  01/16/18     Chief Complaint  Patient presents with   Annual Exam    Pt is fasting    Subjective: Dorothy Campbell is a 42 y.o.  Female  present for CPE. All past medical history, surgical history, allergies, family history, immunizations, medications and social history were updated in the electronic medical record today. All recent labs, ED visits and hospitalizations within the last year were reviewed.  Health maintenance:  Colonoscopy: no fhx.  Complete age 47 Mammogram: No fhx. DUE through  gyn.  Cervical cancer screening: last pap: 07/2020,completed by: Dellis Filbert Immunizations: tdap UTD 10/2014, Influenza declined (encouraged yearly) Infectious disease screening: HIV completed 2012, hep C completed DEXA: routine screen  Assistive device: none Oxygen HER:DEYC Patient has a Dental home. Hospitalizations/ED visits: reviewed     03/29/2022    8:12 AM 03/26/2021    8:05 AM 03/25/2020    8:07 AM 08/15/2018    9:35 AM 01/16/2018    2:17 PM  Depression screen PHQ 2/9  Decreased Interest 0 0 0 0 0  Down, Depressed, Hopeless 0 0 0 0 0  PHQ - 2 Score 0 0 0 0 0      03/29/2022    8:12 AM  GAD 7 : Generalized Anxiety Score  Nervous, Anxious, on Edge 0  Control/stop worrying 0  Worry too much - different things 0  Trouble relaxing 0  Restless 0  Easily annoyed or irritable 0  Afraid - awful might happen 0  Total GAD 7 Score 0     Immunization History  Administered Date(s) Administered   Influenza Whole 11/23/2011   Influenza,inj,Quad PF,6+ Mos 01/16/2018   Tdap 11/03/2014   Past Medical History:  Diagnosis Date   Chicken pox    Hyperlipidemia    Kidney stone    UTI (urinary tract infection)     No Known Allergies Past Surgical History:  Procedure Laterality Date   AUGMENTATION MAMMAPLASTY Bilateral 2013   FOOT SURGERY Bilateral 2016/2017   bone spurs   Family History  Problem Relation Age of Onset   Diabetes Mother    COPD Mother    Other Mother        mild cognitive impairment   Hyperlipidemia Father    Hypertension Father    Diabetes Maternal Grandmother    Heart disease Maternal Grandfather        CHF   Diabetes Maternal Grandfather    Heart disease Paternal Grandfather        MI   Hyperlipidemia Paternal Grandfather    Hypertension Paternal Grandfather    Social History   Social History Narrative   Safety:      -Wears a bicycle helmet riding a bike: Yes     -smoke alarm in the home:Yes     - wears seatbelt: Yes     - Feels safe in their relationships: Yes      Work or School: B.S. Estate agent,  works in [employer] for [employer] Situation: living with spouse and 2 children    Spiritual Beliefs: christian   Lifestyle: 2x per week goes to gym for cardio and weights, healthy diet  Allergies as of 03/29/2022   No Known Allergies      Medication List        Accurate as of March 29, 2022  8:16 AM. If you have any questions, ask your nurse or doctor.          STOP taking these medications    doxylamine (Sleep) 25 MG tablet Commonly known as: UNISOM Stopped by: Howard Pouch, DO   multivitamin tablet Stopped by: Howard Pouch, DO   Zinc 100 MG Tabs Stopped by: Howard Pouch, DO       TAKE these medications    B COMPLEX PO Take by mouth.   cholecalciferol 25 MCG (1000 UNIT) tablet Commonly known as: VITAMIN D3 Take 1,000 Units by mouth daily.        All past medical history, surgical history, allergies, family history, immunizations andmedications were updated in the EMR today and reviewed under the history and medication portions of their EMR.     No results found for this or any previous visit (from the past 2160  hour(s)).  MM 3D SCREEN BREAST W/IMPLANT BILATERAL Result Date: 10/26/2020 RECOMMENDATION: Screening mammogram in one year. (Code:SM-B-01Y) BI-RADS CATEGORY  1:  Negative. Electronically Signed   By: Abelardo Diesel M.D.   On: 10/26/2020 11:37   ROS 14 pt review of systems performed and negative (unless mentioned in an HPI)  Objective: BP 101/67   Pulse 69   Temp 98.3 F (36.8 C)   Ht 5' 5.95" (1.675 m)   Wt 149 lb 3.2 oz (67.7 kg)   LMP 03/08/2022 (Approximate)   SpO2 99%   BMI 24.12 kg/m  Physical Exam Vitals and nursing note reviewed.  Constitutional:      General: She is not in acute distress.    Appearance: Normal appearance. She is not ill-appearing or toxic-appearing.  HENT:     Head: Normocephalic and atraumatic.     Right Ear: Tympanic membrane, ear canal and external ear normal. There is no impacted cerumen.     Left Ear: Tympanic membrane, ear canal and external ear normal. There is no impacted cerumen.     Nose: No congestion or rhinorrhea.     Mouth/Throat:     Mouth: Mucous membranes are moist.     Pharynx: Oropharynx is clear. No oropharyngeal exudate or posterior oropharyngeal erythema.  Eyes:     General: No scleral icterus.       Right eye: No discharge.        Left eye: No discharge.     Extraocular Movements: Extraocular movements intact.     Conjunctiva/sclera: Conjunctivae normal.     Pupils: Pupils are equal, round, and reactive to light.  Cardiovascular:     Rate and Rhythm: Normal rate and regular rhythm.     Pulses: Normal pulses.     Heart sounds: Normal heart sounds. No murmur heard.    No friction rub. No gallop.  Pulmonary:     Effort: Pulmonary effort is normal. No respiratory distress.     Breath sounds: Normal breath sounds. No stridor. No wheezing, rhonchi or rales.  Chest:     Chest wall: No tenderness.  Abdominal:     General: Abdomen is flat. Bowel sounds are normal. There is no distension.     Palpations: Abdomen is soft. There is  no mass.     Tenderness: There is no abdominal tenderness. There is no right CVA tenderness, left CVA tenderness, guarding or rebound.     Hernia:  No hernia is present.  Musculoskeletal:        General: No swelling, tenderness or deformity. Normal range of motion.     Cervical back: Normal range of motion and neck supple. No rigidity or tenderness.     Right lower leg: No edema.     Left lower leg: No edema.  Lymphadenopathy:     Cervical: No cervical adenopathy.  Skin:    General: Skin is warm and dry.     Coloration: Skin is not jaundiced or pale.     Findings: No bruising, erythema, lesion or rash.  Neurological:     General: No focal deficit present.     Mental Status: She is alert and oriented to person, place, and time. Mental status is at baseline.     Cranial Nerves: No cranial nerve deficit.     Sensory: No sensory deficit.     Motor: No weakness.     Coordination: Coordination normal.     Gait: Gait normal.     Deep Tendon Reflexes: Reflexes normal.  Psychiatric:        Mood and Affect: Mood normal.        Behavior: Behavior normal.        Thought Content: Thought content normal.        Judgment: Judgment normal.      No results found.  Assessment/plan: Dorothy Campbell is a 42 y.o. female present for CPE  Elevated LDL cholesterol level Diet and exercise encouraged. Has been able to control without medications.   Routine general medical examination at a health care facility Colonoscopy: no fhx.  Complete age 63 Mammogram: No fhx. DUE through  gyn.  Cervical cancer screening: last pap: 07/2020,completed by: Dellis Filbert Immunizations: tdap UTD 10/2014, Influenza declined (encouraged yearly) Infectious disease screening: HIV completed 2012, hep C completed DEXA: routine screen  Patient was encouraged to exercise greater than 150 minutes a week. Patient was encouraged to choose a diet filled with fresh fruits and vegetables, and lean meats. AVS provided to patient today  for education/recommendation on gender specific health and safety maintenance.  Return in about 1 year (around 03/31/2023) for cpe (20 min).   Orders Placed This Encounter  Procedures   CBC with Differential/Platelet   Comprehensive metabolic panel   Hemoglobin A1c   Lipid panel   TSH   No orders of the defined types were placed in this encounter.  Referral Orders  No referral(s) requested today     Electronically signed by: Howard Pouch, [place]

## 2022-03-29 NOTE — Patient Instructions (Addendum)
Return in about 1 year (around 03/31/2023) for cpe (20 min).        Great to see you today.  I have refilled the medication(s) we provide.   If labs were collected, we will inform you of lab results once received either by echart message or telephone call.   - echart message- for normal results that have been seen by the patient already.   - telephone call: abnormal results or if patient has not viewed results in their echart.  

## 2022-04-25 IMAGING — MG DIGITAL SCREENING BREAST BILAT IMPLANT W/ TOMO W/ CAD
9 of 12 series · 9 of 28 positions shown · non-contrast
Comparison: Prior films

CLINICAL DATA: Screening.

EXAM:
DIGITAL SCREENING BILATERAL MAMMOGRAM WITH IMPLANTS, CAD AND
TOMOSYNTHESIS
TECHNIQUE: Bilateral screening digital craniocaudal and mediolateral oblique
mammograms were obtained. Bilateral screening digital breast
tomosynthesis was performed. The images were evaluated with
computer-aided detection. Standard and/or implant displaced views
were performed.

[L MLO]
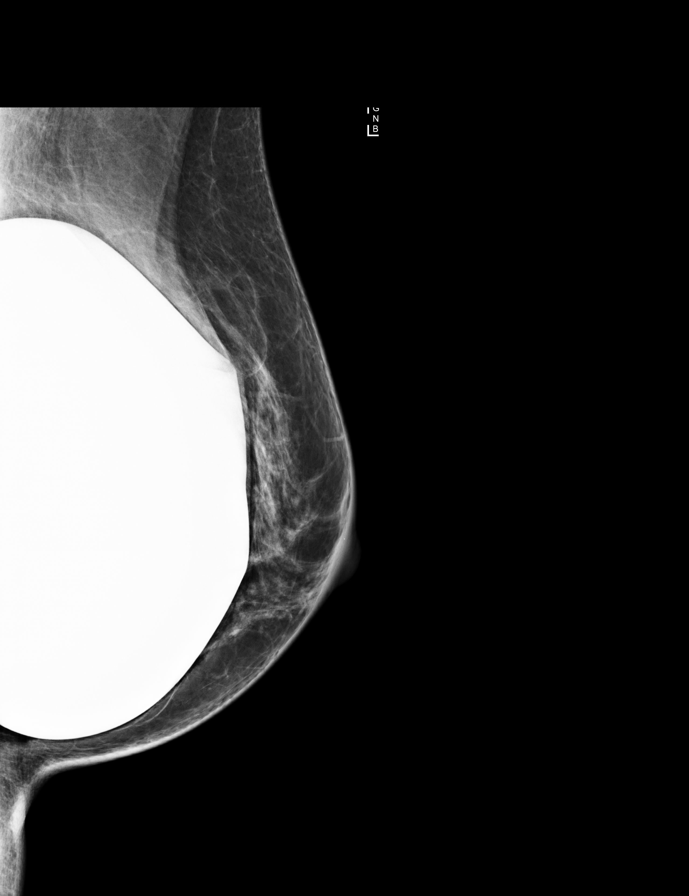

[R CC]
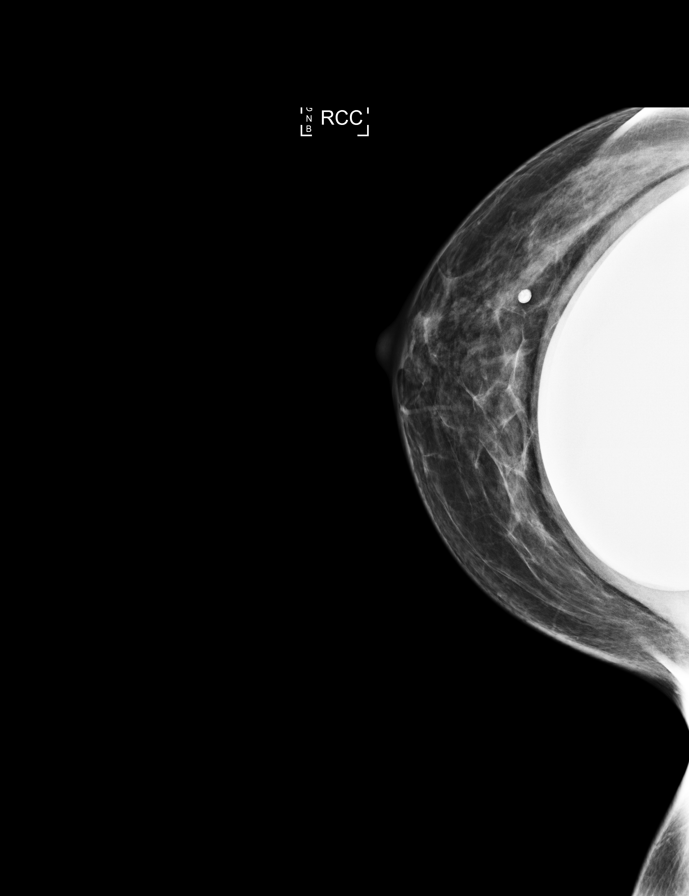

[R MLO]
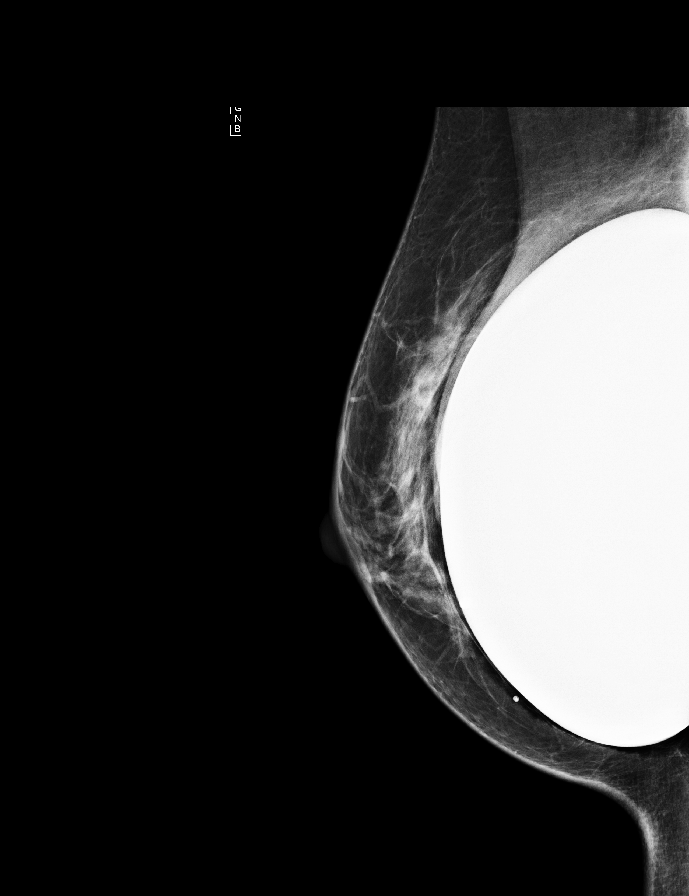

[L CC]
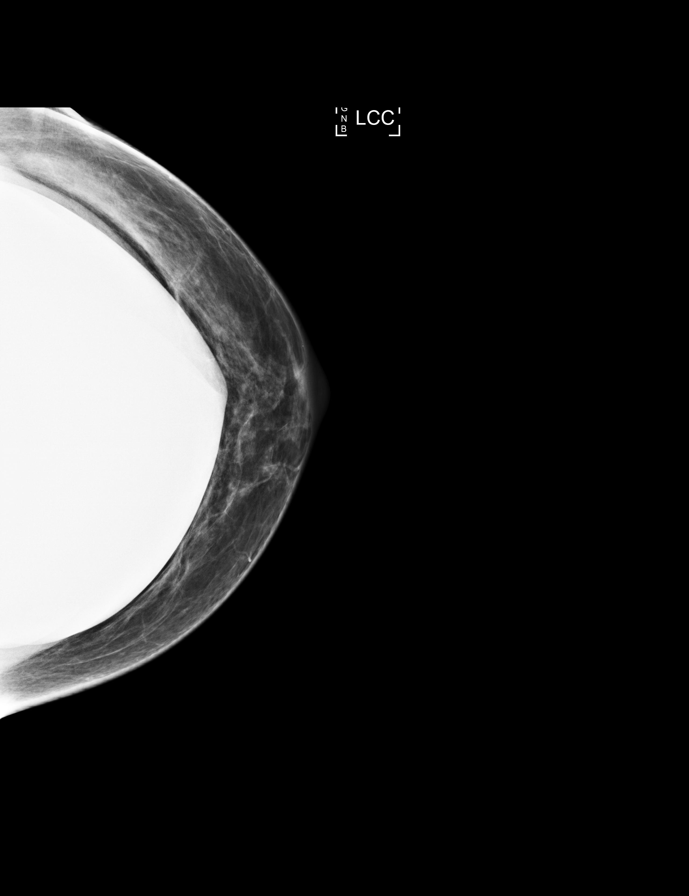

[R MLO synth-2D]
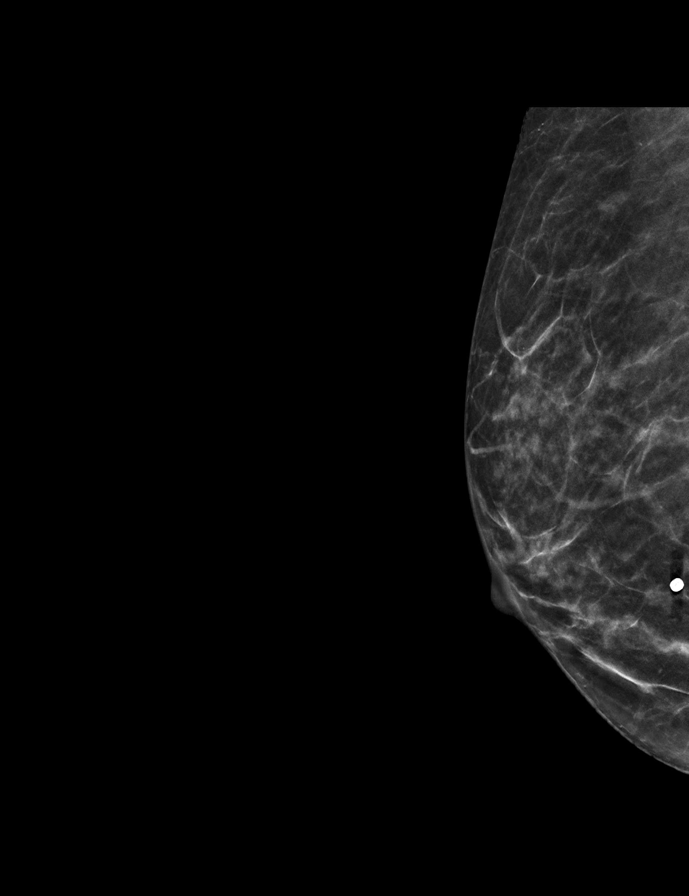

[L MLO synth-2D]
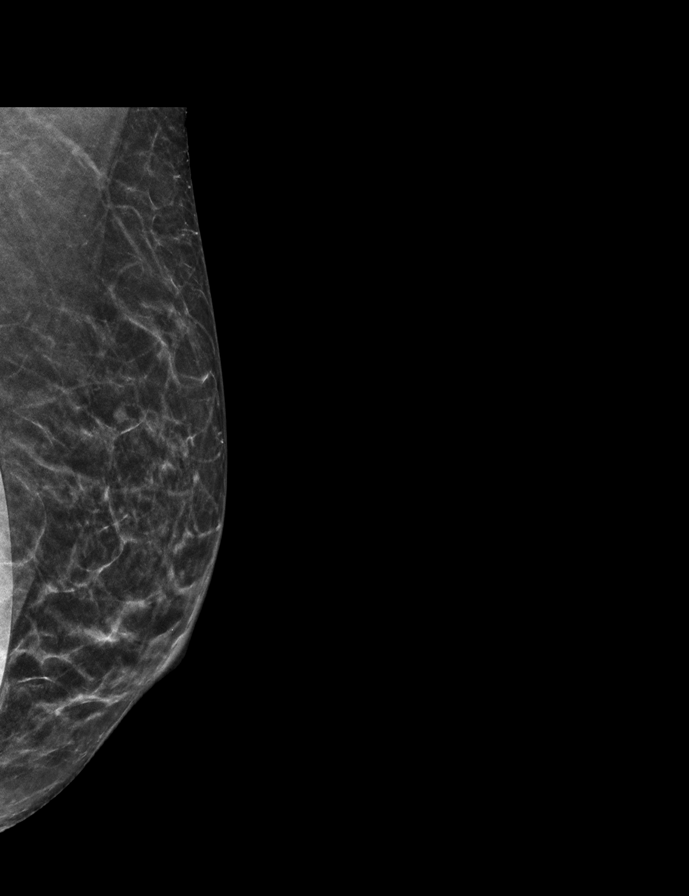

[R CC synth-2D]
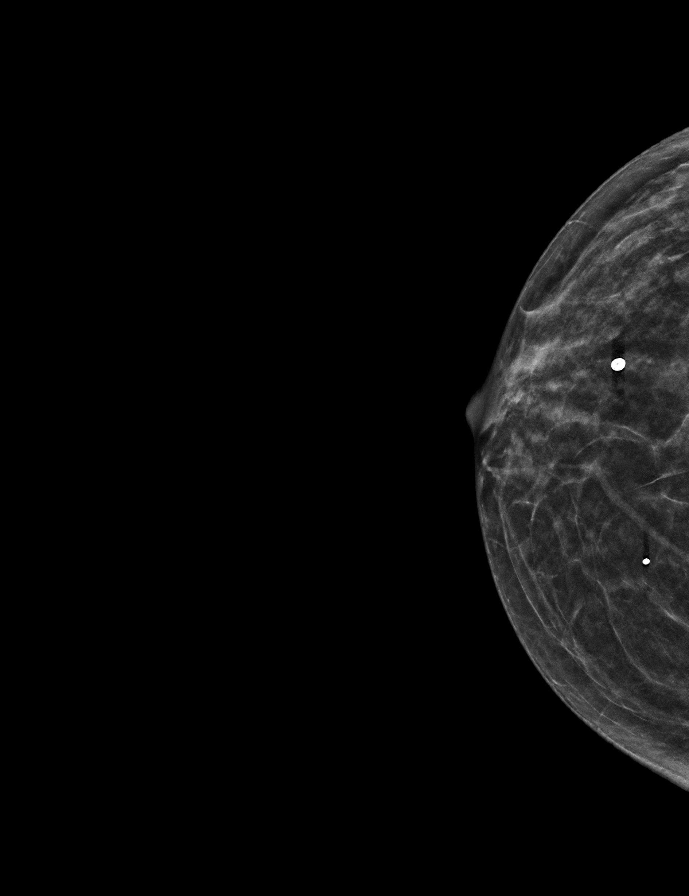

[L CC synth-2D]
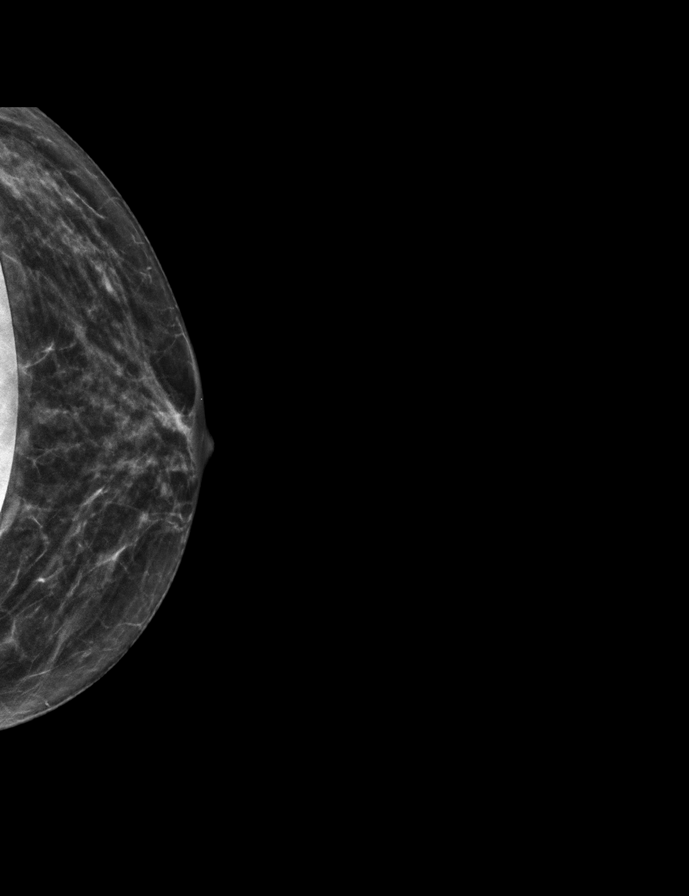

[L MLOID BREAST TOMOSYNTHESIS IMAGE tomo · tomo slice 21/42.0]
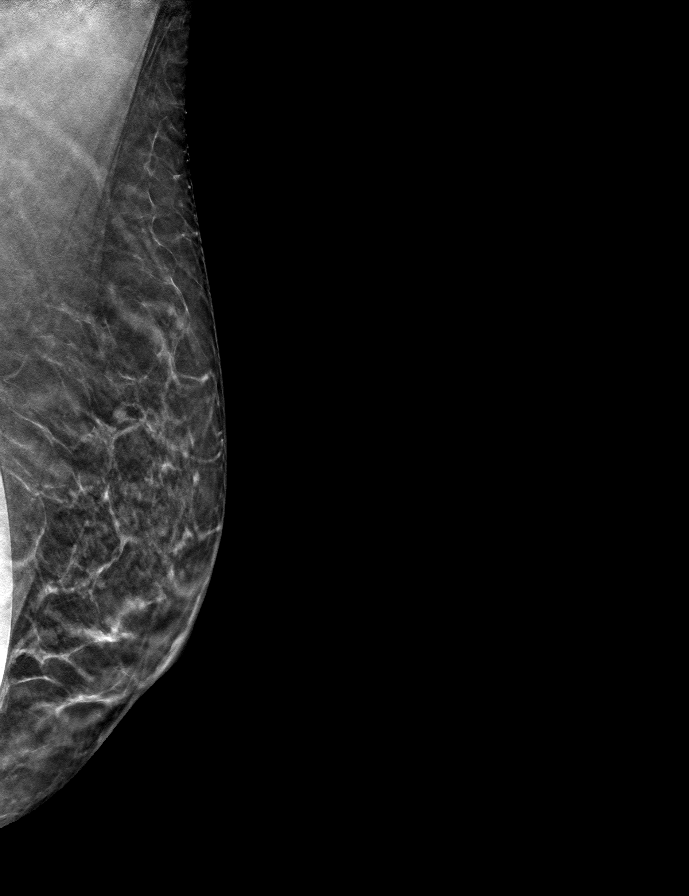

[9 of 28 positions shown; findings below may reference images not displayed]

ACR Breast Density Category c: The breast tissue is heterogeneously
dense, which may obscure small masses.
FINDINGS: The patient has implants. There are no findings suspicious for
malignancy.
IMPRESSION: No mammographic evidence of malignancy. A result letter of this
screening mammogram will be mailed directly to the patient.

RECOMMENDATION:
Screening mammogram in one year. (Code:T1-X-THI)

BI-RADS CATEGORY  1:  Negative.

## 2022-05-31 ENCOUNTER — Other Ambulatory Visit: Payer: Self-pay | Admitting: Obstetrics & Gynecology

## 2022-05-31 DIAGNOSIS — Z1231 Encounter for screening mammogram for malignant neoplasm of breast: Secondary | ICD-10-CM

## 2022-07-13 ENCOUNTER — Ambulatory Visit
Admission: RE | Admit: 2022-07-13 | Discharge: 2022-07-13 | Disposition: A | Discharge status: Home / Self Care | Payer: Commercial Managed Care - PPO | Source: Ambulatory Visit | Attending: Obstetrics & Gynecology | Admitting: Obstetrics & Gynecology

## 2022-07-13 DIAGNOSIS — Z1231 Encounter for screening mammogram for malignant neoplasm of breast: Secondary | ICD-10-CM

## 2022-07-15 ENCOUNTER — Other Ambulatory Visit: Payer: Self-pay | Admitting: Obstetrics & Gynecology

## 2022-07-15 DIAGNOSIS — R928 Other abnormal and inconclusive findings on diagnostic imaging of breast: Secondary | ICD-10-CM

## 2022-07-27 ENCOUNTER — Telehealth: Payer: Self-pay

## 2022-07-27 NOTE — Telephone Encounter (Signed)
Per ML: "I strongly recommend having the Dx mammo and Breast US.  Breast US only has a high risk of missing something important.  A Dx mammo is very low in radiation. Dr. Elbert Ewings"  LDVM on machine per DPR. Advised her to [facility] w/ thoughts after recommendation.

## 2022-07-27 NOTE — Telephone Encounter (Signed)
Pt LVM in triage line reporting that she has received a call back from her screening mammogram and has her dx imaging scheduled for tomorrow. However, she requested to only have US done due to excessive exposure to radiation already in the past year. States she would need note/recommendation from [place]. Please advise.

## 2022-07-28 ENCOUNTER — Ambulatory Visit
Admission: RE | Admit: 2022-07-28 | Discharge: 2022-07-28 | Disposition: A | Discharge status: Home / Self Care | Payer: Commercial Managed Care - PPO | Source: Ambulatory Visit | Attending: Obstetrics & Gynecology | Admitting: Obstetrics & Gynecology

## 2022-07-28 DIAGNOSIS — R928 Other abnormal and inconclusive findings on diagnostic imaging of breast: Secondary | ICD-10-CM

## 2022-09-07 NOTE — Telephone Encounter (Signed)
Pt had dx mammo and US performed on 07/28/2022. Results: birads 2 benign. Will close encounter.

## 2022-09-22 ENCOUNTER — Ambulatory Visit: Payer: Commercial Managed Care - PPO | Admitting: Obstetrics & Gynecology

## 2023-03-31 ENCOUNTER — Encounter: Payer: Commercial Managed Care - PPO | Admitting: Family Medicine
